# Patient Record
Sex: Female | Born: 1975
Health system: Southern US, Community
[De-identification: ages and names within clinical notes are randomized; demographics above are authoritative.]

## PROBLEM LIST (undated history)

## (undated) DIAGNOSIS — I10 Essential (primary) hypertension: Secondary | ICD-10-CM

## (undated) DIAGNOSIS — A549 Gonococcal infection, unspecified: Secondary | ICD-10-CM

## (undated) DIAGNOSIS — B9689 Other specified bacterial agents as the cause of diseases classified elsewhere: Secondary | ICD-10-CM

## (undated) DIAGNOSIS — N760 Acute vaginitis: Secondary | ICD-10-CM

## (undated) DIAGNOSIS — F419 Anxiety disorder, unspecified: Secondary | ICD-10-CM

## (undated) DIAGNOSIS — R519 Headache, unspecified: Secondary | ICD-10-CM

## (undated) DIAGNOSIS — O009 Unspecified ectopic pregnancy without intrauterine pregnancy: Secondary | ICD-10-CM

## (undated) DIAGNOSIS — A749 Chlamydial infection, unspecified: Secondary | ICD-10-CM

## (undated) DIAGNOSIS — M1990 Unspecified osteoarthritis, unspecified site: Secondary | ICD-10-CM

## (undated) HISTORY — PX: DILATION AND CURETTAGE OF UTERUS: SHX78

## (undated) HISTORY — PX: THERAPEUTIC ABORTION: SHX798

## (undated) HISTORY — PX: ECTOPIC PREGNANCY SURGERY: SHX613

---

## 2003-08-20 ENCOUNTER — Emergency Department (HOSPITAL_COMMUNITY): Admission: EM | Admit: 2003-08-20 | Discharge: 2003-08-20 | Payer: Self-pay | Admitting: Family Medicine

## 2003-10-05 ENCOUNTER — Emergency Department (HOSPITAL_COMMUNITY): Admission: EM | Admit: 2003-10-05 | Discharge: 2003-10-05 | Payer: Self-pay | Admitting: Emergency Medicine

## 2004-12-11 ENCOUNTER — Emergency Department (HOSPITAL_COMMUNITY): Admission: EM | Admit: 2004-12-11 | Discharge: 2004-12-11 | Payer: Self-pay | Admitting: Emergency Medicine

## 2005-04-16 ENCOUNTER — Emergency Department (HOSPITAL_COMMUNITY): Admission: EM | Admit: 2005-04-16 | Discharge: 2005-04-16 | Payer: Self-pay | Admitting: Emergency Medicine

## 2005-10-03 ENCOUNTER — Emergency Department (HOSPITAL_COMMUNITY): Admission: EM | Admit: 2005-10-03 | Discharge: 2005-10-04 | Payer: Self-pay | Admitting: Emergency Medicine

## 2006-03-05 ENCOUNTER — Emergency Department (HOSPITAL_COMMUNITY): Admission: EM | Admit: 2006-03-05 | Discharge: 2006-03-05 | Payer: Self-pay | Admitting: Emergency Medicine

## 2006-03-11 ENCOUNTER — Ambulatory Visit: Payer: Self-pay | Admitting: *Deleted

## 2007-02-26 ENCOUNTER — Emergency Department (HOSPITAL_COMMUNITY): Admission: EM | Admit: 2007-02-26 | Discharge: 2007-02-26 | Payer: Self-pay | Admitting: Family Medicine

## 2007-05-29 ENCOUNTER — Emergency Department (HOSPITAL_COMMUNITY): Admission: EM | Admit: 2007-05-29 | Discharge: 2007-05-29 | Payer: Self-pay | Admitting: Family Medicine

## 2007-06-22 ENCOUNTER — Emergency Department (HOSPITAL_COMMUNITY): Admission: EM | Admit: 2007-06-22 | Discharge: 2007-06-22 | Payer: Self-pay | Admitting: Family Medicine

## 2007-10-12 ENCOUNTER — Emergency Department (HOSPITAL_COMMUNITY): Admission: EM | Admit: 2007-10-12 | Discharge: 2007-10-12 | Payer: Self-pay | Admitting: Family Medicine

## 2008-08-28 ENCOUNTER — Emergency Department (HOSPITAL_COMMUNITY): Admission: EM | Admit: 2008-08-28 | Discharge: 2008-08-29 | Payer: Self-pay | Admitting: Emergency Medicine

## 2008-10-25 ENCOUNTER — Emergency Department (HOSPITAL_COMMUNITY): Admission: EM | Admit: 2008-10-25 | Discharge: 2008-10-25 | Payer: Self-pay | Admitting: Emergency Medicine

## 2008-11-27 ENCOUNTER — Emergency Department (HOSPITAL_COMMUNITY): Admission: EM | Admit: 2008-11-27 | Discharge: 2008-11-27 | Payer: Self-pay | Admitting: Emergency Medicine

## 2009-12-12 ENCOUNTER — Emergency Department (HOSPITAL_COMMUNITY): Admission: EM | Admit: 2009-12-12 | Discharge: 2009-12-12 | Payer: Self-pay | Admitting: Internal Medicine

## 2009-12-30 ENCOUNTER — Emergency Department (HOSPITAL_COMMUNITY): Admission: EM | Admit: 2009-12-30 | Discharge: 2009-12-31 | Payer: Self-pay | Admitting: Emergency Medicine

## 2010-01-01 ENCOUNTER — Inpatient Hospital Stay (HOSPITAL_COMMUNITY): Admission: AD | Admit: 2010-01-01 | Discharge: 2010-01-01 | Payer: Self-pay | Admitting: Family Medicine

## 2010-01-01 ENCOUNTER — Ambulatory Visit: Payer: Self-pay | Admitting: Family Medicine

## 2010-01-04 ENCOUNTER — Ambulatory Visit: Admission: RE | Admit: 2010-01-04 | Discharge: 2010-01-04 | Payer: Self-pay | Admitting: Obstetrics & Gynecology

## 2010-01-07 ENCOUNTER — Inpatient Hospital Stay (HOSPITAL_COMMUNITY): Admission: AD | Admit: 2010-01-07 | Discharge: 2010-01-07 | Payer: Self-pay | Admitting: Family Medicine

## 2010-01-07 ENCOUNTER — Ambulatory Visit: Payer: Self-pay | Admitting: Gynecology

## 2010-01-16 ENCOUNTER — Inpatient Hospital Stay (HOSPITAL_COMMUNITY): Admission: AD | Admit: 2010-01-16 | Discharge: 2010-01-16 | Payer: Self-pay | Admitting: Obstetrics and Gynecology

## 2010-01-23 ENCOUNTER — Inpatient Hospital Stay (HOSPITAL_COMMUNITY): Admission: AD | Admit: 2010-01-23 | Discharge: 2010-01-23 | Payer: Self-pay | Admitting: Obstetrics & Gynecology

## 2010-01-31 ENCOUNTER — Inpatient Hospital Stay (HOSPITAL_COMMUNITY): Admission: AD | Admit: 2010-01-31 | Discharge: 2010-01-31 | Payer: Self-pay | Admitting: Obstetrics & Gynecology

## 2010-02-01 ENCOUNTER — Inpatient Hospital Stay (HOSPITAL_COMMUNITY)
Admission: AD | Admit: 2010-02-01 | Discharge: 2010-02-01 | Payer: Self-pay | Source: Home / Self Care | Admitting: Obstetrics & Gynecology

## 2010-02-06 ENCOUNTER — Inpatient Hospital Stay (HOSPITAL_COMMUNITY)
Admission: AD | Admit: 2010-02-06 | Discharge: 2010-02-06 | Payer: Self-pay | Source: Home / Self Care | Admitting: Obstetrics & Gynecology

## 2010-02-06 DEATH — deceased

## 2010-02-13 ENCOUNTER — Inpatient Hospital Stay (HOSPITAL_COMMUNITY)
Admission: AD | Admit: 2010-02-13 | Discharge: 2010-02-13 | Payer: Self-pay | Source: Home / Self Care | Admitting: Obstetrics and Gynecology

## 2010-05-20 LAB — HCG, QUANTITATIVE, PREGNANCY
hCG, Beta Chain, Quant, S: <2 m[IU]/mL (ref <5)
hCG, Beta Chain, Quant, S: 1044 m[IU]/mL — ABNORMAL HIGH (ref <5)
hCG, Beta Chain, Quant, S: 58 m[IU]/mL — ABNORMAL HIGH (ref <5)
hCG, Beta Chain, Quant, S: 74 m[IU]/mL — ABNORMAL HIGH (ref <5)

## 2010-05-20 LAB — DIFFERENTIAL
Eosinophils Absolute: 0.2 10*3/uL (ref 0.0–0.7)
Eosinophils Relative: 4 % (ref 0–5)
Lymphs Abs: 1.7 10*3/uL (ref 0.7–4.0)
Monocytes Relative: 7 % (ref 3–12)

## 2010-05-20 LAB — CBC
MCH: 24.8 pg — ABNORMAL LOW (ref 26.0–34.0)
MCV: 77.1 fL — ABNORMAL LOW (ref 78.0–100.0)
Platelets: 323 10*3/uL (ref 150–400)
RDW: 15.2 % (ref 11.5–15.5)

## 2010-05-21 LAB — BASIC METABOLIC PANEL
CO2: 26 mEq/L (ref 19–32)
Calcium: 9.5 mg/dL (ref 8.4–10.5)
GFR calc Af Amer: >60 mL/min (ref >60)
Sodium: 137 mEq/L (ref 135–145)

## 2010-05-21 LAB — COMPREHENSIVE METABOLIC PANEL
Albumin: 3.6 g/dL (ref 3.5–5.2)
Alkaline Phosphatase: 45 U/L (ref 39–117)
BUN: 8 mg/dL (ref 6–23)
Chloride: 103 mEq/L (ref 96–112)
Creatinine, Ser: 0.79 mg/dL (ref 0.4–1.2)
GFR calc non Af Amer: >60 mL/min (ref >60)
Glucose, Bld: 94 mg/dL (ref 70–99)
Potassium: 3.8 mEq/L (ref 3.5–5.1)
Total Bilirubin: 0.5 mg/dL (ref 0.3–1.2)

## 2010-05-21 LAB — URINALYSIS, ROUTINE W REFLEX MICROSCOPIC
Glucose, UA: NEGATIVE mg/dL
Hgb urine dipstick: NEGATIVE
Ketones, ur: NEGATIVE mg/dL
Protein, ur: NEGATIVE mg/dL

## 2010-05-21 LAB — CBC
Hemoglobin: 11.5 g/dL — ABNORMAL LOW (ref 12.0–15.0)
Hemoglobin: 11.1 g/dL — ABNORMAL LOW (ref 12.0–15.0)
MCH: 24.7 pg — ABNORMAL LOW (ref 26.0–34.0)
MCH: 24.9 pg — ABNORMAL LOW (ref 26.0–34.0)
MCHC: 32.6 g/dL (ref 30.0–36.0)
MCV: 76.4 fL — ABNORMAL LOW (ref 78.0–100.0)
Platelets: 310 10*3/uL (ref 150–400)
RBC: 4.64 MIL/uL (ref 3.87–5.11)
RBC: 4.44 MIL/uL (ref 3.87–5.11)
WBC: 9.3 10*3/uL (ref 4.0–10.5)
WBC: 6.2 10*3/uL (ref 4.0–10.5)

## 2010-05-21 LAB — HCG, QUANTITATIVE, PREGNANCY
hCG, Beta Chain, Quant, S: 1927 m[IU]/mL — ABNORMAL HIGH (ref <5)
hCG, Beta Chain, Quant, S: 1657 m[IU]/mL — ABNORMAL HIGH (ref <5)
hCG, Beta Chain, Quant, S: 1819 m[IU]/mL — ABNORMAL HIGH (ref <5)

## 2010-05-21 LAB — WET PREP, GENITAL: Trich, Wet Prep: NONE SEEN

## 2010-05-21 LAB — URINE MICROSCOPIC-ADD ON

## 2010-05-21 LAB — ABO/RH: ABO/RH(D): A POS

## 2010-05-21 LAB — PREGNANCY, URINE: Preg Test, Ur: POSITIVE

## 2010-06-16 LAB — CBC
Hemoglobin: 11.1 g/dL — ABNORMAL LOW (ref 12.0–15.0)
MCHC: 33.2 g/dL (ref 30.0–36.0)
Platelets: 304 10*3/uL (ref 150–400)
RDW: 14.9 % (ref 11.5–15.5)

## 2010-06-16 LAB — COMPREHENSIVE METABOLIC PANEL
ALT: 20 U/L (ref 0–35)
Albumin: 3.5 g/dL (ref 3.5–5.2)
Alkaline Phosphatase: 36 U/L — ABNORMAL LOW (ref 39–117)
Calcium: 9.1 mg/dL (ref 8.4–10.5)
GFR calc Af Amer: >60 mL/min (ref >60)
Glucose, Bld: 92 mg/dL (ref 70–99)
Potassium: 3.7 mEq/L (ref 3.5–5.1)
Sodium: 135 mEq/L (ref 135–145)
Total Protein: 7 g/dL (ref 6.0–8.3)

## 2010-06-16 LAB — GC/CHLAMYDIA PROBE AMP, GENITAL
Chlamydia, DNA Probe: NEGATIVE
GC Probe Amp, Genital: NEGATIVE

## 2010-06-16 LAB — WET PREP, GENITAL
Trich, Wet Prep: NONE SEEN
WBC, Wet Prep HPF POC: NONE SEEN
Yeast Wet Prep HPF POC: NONE SEEN

## 2010-06-16 LAB — URINALYSIS, ROUTINE W REFLEX MICROSCOPIC
Ketones, ur: NEGATIVE mg/dL
Nitrite: NEGATIVE
Protein, ur: NEGATIVE mg/dL
Urobilinogen, UA: 1 mg/dL (ref 0.0–1.0)

## 2010-06-16 LAB — PREGNANCY, URINE: Preg Test, Ur: POSITIVE

## 2010-06-16 LAB — DIFFERENTIAL
Eosinophils Absolute: 0.3 10*3/uL (ref 0.0–0.7)
Lymphs Abs: 2.1 10*3/uL (ref 0.7–4.0)
Monocytes Absolute: 0.4 10*3/uL (ref 0.1–1.0)
Monocytes Relative: 5 % (ref 3–12)
Neutrophils Relative %: 65 % (ref 43–77)

## 2010-07-12 ENCOUNTER — Emergency Department (HOSPITAL_COMMUNITY)
Admission: EM | Admit: 2010-07-12 | Discharge: 2010-07-12 | Disposition: A | Discharge status: Home / Self Care | Payer: No Typology Code available for payment source | Attending: Emergency Medicine | Admitting: Emergency Medicine

## 2010-07-12 DIAGNOSIS — E669 Obesity, unspecified: Secondary | ICD-10-CM | POA: Insufficient documentation

## 2010-07-12 DIAGNOSIS — M25569 Pain in unspecified knee: Secondary | ICD-10-CM | POA: Insufficient documentation

## 2010-07-12 DIAGNOSIS — M62838 Other muscle spasm: Secondary | ICD-10-CM | POA: Insufficient documentation

## 2010-07-12 DIAGNOSIS — M79609 Pain in unspecified limb: Secondary | ICD-10-CM | POA: Insufficient documentation

## 2010-07-12 LAB — POCT I-STAT, CHEM 8
Creatinine, Ser: 1 mg/dL (ref 0.4–1.2)
Glucose, Bld: 92 mg/dL (ref 70–99)
HCT: 36 % (ref 36.0–46.0)
Hemoglobin: 12.2 g/dL (ref 12.0–15.0)
Sodium: 141 mEq/L (ref 135–145)
TCO2: 27 mmol/L (ref 0–100)

## 2010-12-29 ENCOUNTER — Encounter (HOSPITAL_COMMUNITY): Payer: Self-pay | Admitting: *Deleted

## 2010-12-29 ENCOUNTER — Inpatient Hospital Stay (HOSPITAL_COMMUNITY)
Admission: AD | Admit: 2010-12-29 | Discharge: 2010-12-29 | Disposition: A | Discharge status: Home / Self Care | Payer: No Typology Code available for payment source | Source: Ambulatory Visit | Attending: Obstetrics & Gynecology | Admitting: Obstetrics & Gynecology

## 2010-12-29 DIAGNOSIS — B9689 Other specified bacterial agents as the cause of diseases classified elsewhere: Secondary | ICD-10-CM | POA: Insufficient documentation

## 2010-12-29 DIAGNOSIS — N760 Acute vaginitis: Secondary | ICD-10-CM | POA: Insufficient documentation

## 2010-12-29 DIAGNOSIS — A499 Bacterial infection, unspecified: Secondary | ICD-10-CM

## 2010-12-29 DIAGNOSIS — M545 Low back pain, unspecified: Secondary | ICD-10-CM | POA: Insufficient documentation

## 2010-12-29 HISTORY — DX: Unspecified ectopic pregnancy without intrauterine pregnancy: O00.90

## 2010-12-29 LAB — URINALYSIS, ROUTINE W REFLEX MICROSCOPIC
Bilirubin Urine: NEGATIVE
Glucose, UA: NEGATIVE mg/dL
Hgb urine dipstick: NEGATIVE
Ketones, ur: NEGATIVE mg/dL
pH: 6 (ref 5.0–8.0)

## 2010-12-29 LAB — WET PREP, GENITAL
Trich, Wet Prep: NONE SEEN
Yeast Wet Prep HPF POC: NONE SEEN

## 2010-12-29 MED ORDER — METRONIDAZOLE 500 MG PO TABS: 500.0000 mg | ORAL_TABLET | Freq: Two times a day (BID) | ORAL | Status: AC

## 2010-12-29 MED ORDER — IBUPROFEN 600 MG PO TABS: 600.0000 mg | ORAL_TABLET | Freq: Four times a day (QID) | ORAL | Status: AC | PRN

## 2010-12-29 MED ORDER — CYCLOBENZAPRINE HCL 10 MG PO TABS
10.0000 mg | ORAL_TABLET | Freq: Two times a day (BID) | ORAL | Status: AC | PRN
Start: 2010-12-29 — End: 2011-01-08

## 2010-12-29 NOTE — ED Provider Notes (Signed)
History     CSN: [id number] Arrival date & time: 12/29/2010 10:35 AM   None     Chief Complaint  Patient presents with  . Back Pain   HPI Victoria ZAUGG is a 35 y.o. female who presents to MAU for low back pain that started 12/08/10 but usually has same pain before period starts. After period ended continues to have the same pain. LMP 12/18/10. Pain radiates from lower back around to lower abdomen. Pain increases with walking, movement, staying in same position for a long time and lifting. Current sex partner x 4 years. History of GC and Chlamydia "years ago". The history was provided by the patient.  Past Medical History  Diagnosis Date  . Pregnancy, ectopic     Past Surgical History  Procedure Date  . No past surgeries     No family history on file.  History  Substance Use Topics  . Smoking status: Current Some Day Smoker  . Smokeless tobacco: Not on file  . Alcohol Use: Yes    OB History    Grav Para Term Preterm Abortions TAB SAB Ect Mult Living   7    3 2  1  4       Review of Systems  Constitutional: Negative for fever, chills, diaphoresis, fatigue and unexpected weight change.  HENT: Negative for ear pain, congestion, sore throat, facial swelling, neck pain, neck stiffness, dental problem and sinus pressure.   Eyes: Negative for photophobia, pain and discharge.  Respiratory: Negative for cough, chest tightness and wheezing.   Cardiovascular: Negative.   Gastrointestinal: Positive for nausea, abdominal pain and constipation. Negative for vomiting and diarrhea.  Genitourinary: Negative for dysuria, urgency, frequency, flank pain, vaginal bleeding, vaginal discharge and difficulty urinating.  Musculoskeletal: Positive for myalgias and back pain. Negative for gait problem.  Skin: Positive for rash. Negative for color change.  Neurological: Positive for headaches. Negative for dizziness, speech difficulty, weakness, light-headedness and numbness.    Psychiatric/Behavioral: Negative for confusion and agitation.    Allergies  Review of patient's allergies indicates no known allergies.  Home Medications  No current outpatient prescriptions on file.  BP 117/70  Pulse 84  Temp(Src) 98.6 F (37 C) (Oral)  Resp 20  Ht 5\' 6"  (1.676 m)  Wt 240 lb (108.863 kg)  BMI 38.74 kg/m2  LMP 12/18/2010  Physical Exam  Nursing note and vitals reviewed. Constitutional: She is oriented to person, place, and time. She appears well-developed and well-nourished.  HENT:  Head: Normocephalic and atraumatic.  Eyes: EOM are normal.  Neck: Neck supple.  Cardiovascular: Normal rate.   Pulmonary/Chest: Effort normal.  Abdominal: Soft. There is no tenderness.  Genitourinary:       External genitalia without lesions. White discharge vaginal vault. No CMT, no adnexal tenderness. Uterus without palpable enlargement.  Musculoskeletal: Normal range of motion.  Neurological: She is alert and oriented to person, place, and time. She has normal strength and normal reflexes. No cranial nerve deficit. She displays a negative Romberg sign.  Skin: Skin is warm and dry.  Psychiatric: She has a normal mood and affect. Her behavior is normal. Judgment and thought content normal.   Assessment: Low back strain   Bacterial vaginosis  Plan:  Flexeril 10 mg. Po bid   Ibuprofen 600 mg. Po q 6 hours   Flagyl 500 mg. Po bid x 7 days  ED Course  Procedures MDM          Kerrie Buffalo, NP 12/29/10 1232

## 2010-12-29 NOTE — Progress Notes (Signed)
Pt states she is having lower abdominal and back pain for a couple of days, feels constipated. Pt had BM today but was small and really hard.Pt has taken nothing to help with constipation. No vaginal bleeding or discharge.  Pt has a hx of an ectopic and pt states it feels like it did then. Uses condoms for [facility] occasionally.

## 2010-12-29 NOTE — Progress Notes (Signed)
Pressure and pain in low back and abd.  Constipated, has tried increasing bran.  When does goes, is a small hard stool.

## 2010-12-30 LAB — GC/CHLAMYDIA PROBE AMP, GENITAL: Chlamydia, DNA Probe: NEGATIVE

## 2011-02-26 ENCOUNTER — Inpatient Hospital Stay (HOSPITAL_COMMUNITY)
Admission: AD | Admit: 2011-02-26 | Discharge: 2011-02-26 | Disposition: A | Discharge status: Home / Self Care | Payer: No Typology Code available for payment source | Source: Ambulatory Visit | Attending: Obstetrics & Gynecology | Admitting: Obstetrics & Gynecology

## 2011-02-26 ENCOUNTER — Encounter (HOSPITAL_COMMUNITY): Payer: Self-pay | Admitting: *Deleted

## 2011-02-26 ENCOUNTER — Inpatient Hospital Stay (HOSPITAL_COMMUNITY): Payer: No Typology Code available for payment source

## 2011-02-26 DIAGNOSIS — O26899 Other specified pregnancy related conditions, unspecified trimester: Secondary | ICD-10-CM

## 2011-02-26 DIAGNOSIS — B3731 Acute candidiasis of vulva and vagina: Secondary | ICD-10-CM | POA: Insufficient documentation

## 2011-02-26 DIAGNOSIS — B9689 Other specified bacterial agents as the cause of diseases classified elsewhere: Secondary | ICD-10-CM | POA: Insufficient documentation

## 2011-02-26 DIAGNOSIS — O2390 Unspecified genitourinary tract infection in pregnancy, unspecified trimester: Secondary | ICD-10-CM | POA: Insufficient documentation

## 2011-02-26 DIAGNOSIS — O219 Vomiting of pregnancy, unspecified: Secondary | ICD-10-CM

## 2011-02-26 DIAGNOSIS — N760 Acute vaginitis: Secondary | ICD-10-CM | POA: Insufficient documentation

## 2011-02-26 DIAGNOSIS — B373 Candidiasis of vulva and vagina: Secondary | ICD-10-CM | POA: Insufficient documentation

## 2011-02-26 DIAGNOSIS — A499 Bacterial infection, unspecified: Secondary | ICD-10-CM | POA: Insufficient documentation

## 2011-02-26 DIAGNOSIS — R109 Unspecified abdominal pain: Secondary | ICD-10-CM | POA: Insufficient documentation

## 2011-02-26 LAB — CBC
HCT: 35.5 % — ABNORMAL LOW (ref 36.0–46.0)
Hemoglobin: 11.3 g/dL — ABNORMAL LOW (ref 12.0–15.0)
MCH: 24.3 pg — ABNORMAL LOW (ref 26.0–34.0)
MCHC: 31.8 g/dL (ref 30.0–36.0)
RDW: 15 % (ref 11.5–15.5)

## 2011-02-26 LAB — POCT PREGNANCY, URINE: Preg Test, Ur: POSITIVE

## 2011-02-26 LAB — HCG, QUANTITATIVE, PREGNANCY: hCG, Beta Chain, Quant, S: 33383 m[IU]/mL — ABNORMAL HIGH (ref <5)

## 2011-02-26 LAB — URINALYSIS, ROUTINE W REFLEX MICROSCOPIC
Glucose, UA: NEGATIVE mg/dL
Ketones, ur: NEGATIVE mg/dL
Leukocytes, UA: NEGATIVE
Protein, ur: NEGATIVE mg/dL
Urobilinogen, UA: 1 mg/dL (ref 0.0–1.0)

## 2011-02-26 LAB — WET PREP, GENITAL

## 2011-02-26 MED ORDER — PROMETHAZINE HCL 25 MG PO TABS: 25.0000 mg | ORAL_TABLET | Freq: Four times a day (QID) | ORAL | Status: AC | PRN

## 2011-02-26 MED ORDER — FLUCONAZOLE 150 MG PO TABS: 150.0000 mg | ORAL_TABLET | Freq: Once | ORAL | Status: AC

## 2011-02-26 MED ORDER — ONDANSETRON 8 MG PO TBDP
8.0000 mg | ORAL_TABLET | Freq: Once | ORAL | Status: AC
  Administered 2011-02-26: 8 mg via ORAL
  Filled 2011-02-26: qty 1

## 2011-02-26 MED ORDER — ONDANSETRON 8 MG PO TBDP: 8.0000 mg | ORAL_TABLET | Freq: Three times a day (TID) | ORAL | Status: AC | PRN

## 2011-02-26 MED ORDER — METRONIDAZOLE 500 MG PO TABS: 500.0000 mg | ORAL_TABLET | Freq: Two times a day (BID) | ORAL | Status: AC

## 2011-02-26 NOTE — ED Provider Notes (Signed)
History     Chief Complaint  Patient presents with  . Abdominal Pain   HPI Pt is [redacted] weeks pregnant by LMP 01/15/2011 with c/o of abdominal cramping and vaginal pain.  Pt has hx of ectopic pregnancy.  Pt is not in long term relationship.   Past Medical History  Diagnosis Date  . Pregnancy, ectopic     Past Surgical History  Procedure Date  . Ectopic pregnancy surgery   . Dilation and curettage of uterus     Family History  Problem Relation Age of Onset  . Diabetes Maternal Grandmother     History  Substance Use Topics  . Smoking status: Former Games developer  . Smokeless tobacco: Not on file  . Alcohol Use: Yes     occasionally    Allergies: No Known Allergies  Prescriptions prior to admission  Medication Sig Dispense Refill  . acetaminophen (TYLENOL) 500 MG tablet Take 1,000 mg by mouth daily as needed. For pain       . diphenhydrAMINE (BENADRYL) 25 mg capsule Take 50 mg by mouth 2 (two) times daily as needed. For itching         ROS Physical Exam   Blood pressure 123/67, pulse 88, temperature 99.1 F (37.3 C), temperature source Oral, resp. rate 20, height 5\' 5"  (1.651 m), weight 244 lb (110.678 kg), last menstrual period 01/15/2011, SpO2 99.00%.  Physical Exam  Vitals reviewed. Constitutional: She appears well-developed and well-nourished.  HENT:  Head: Normocephalic.  Eyes: Pupils are equal, round, and reactive to light.  Neck: Normal range of motion.  Cardiovascular: Normal rate.   Respiratory: Effort normal.  GI: Soft. She exhibits no distension. There is no tenderness.  Genitourinary: Vagina normal and uterus normal.       Slightly reddened vaginal mucosa; small amount of frothy white discharge in vault; cervix clean, NT; right adnexa slightly tender no rebound or guarding; exam complicated by habitus  Neurological: She is alert.  Skin: Skin is warm and dry.  Psychiatric: She has a normal mood and affect.    MAU Course  Procedures Wet  prep GC/chlamydia Ultrasound worksheet showed IUP [redacted]w[redacted]d with EDC 10/13/2011 ; YS, embryo with cardiac activity 139bpm Right CLC noted Results for orders placed during the hospital encounter of 02/26/11 (from the past 24 hour(s))  URINALYSIS, ROUTINE W REFLEX MICROSCOPIC     Status: Normal   Collection Time   02/26/11  6:25 PM      Component Value Range   Color, Urine YELLOW  YELLOW    APPearance CLEAR  CLEAR    Specific Gravity, Urine 1.020  1.005 - 1.030    pH 7.0  5.0 - 8.0    Glucose, UA NEGATIVE  NEGATIVE (mg/dL)   Hgb urine dipstick NEGATIVE  NEGATIVE    Bilirubin Urine NEGATIVE  NEGATIVE    Ketones, ur NEGATIVE  NEGATIVE (mg/dL)   Protein, ur NEGATIVE  NEGATIVE (mg/dL)   Urobilinogen, UA 1.0  0.0 - 1.0 (mg/dL)   Nitrite NEGATIVE  NEGATIVE    Leukocytes, UA NEGATIVE  NEGATIVE   POCT PREGNANCY, URINE     Status: Normal   Collection Time   02/26/11  6:29 PM      Component Value Range   Preg Test, Ur POSITIVE    CBC     Status: Abnormal   Collection Time   02/26/11  6:57 PM      Component Value Range   WBC 10.0  4.0 - 10.5 (K/uL)   RBC  4.65  3.87 - 5.11 (MIL/uL)   Hemoglobin 11.3 (*) 12.0 - 15.0 (g/dL)   HCT 16.1 (*) 09.6 - 46.0 (%)   MCV 76.3 (*) 78.0 - 100.0 (fL)   MCH 24.3 (*) 26.0 - 34.0 (pg)   MCHC 31.8  30.0 - 36.0 (g/dL)   RDW 04.5  40.9 - 81.1 (%)   Platelets 336  150 - 400 (K/uL)  HCG, QUANTITATIVE, PREGNANCY     Status: Abnormal   Collection Time   02/26/11  6:57 PM      Component Value Range   hCG, Beta Chain, Quant, [place] [address] (*) <5 (mIU/mL)  WET PREP, GENITAL     Status: Abnormal   Collection Time   02/26/11  7:10 PM      Component Value Range   Yeast, Wet Prep NONE SEEN  NONE SEEN    Trich, Wet Prep NONE SEEN  NONE SEEN    Clue Cells, Wet Prep FEW (*) NONE SEEN    WBC, Wet Prep HPF POC TOO NUMEROUS TO COUNT (*) NONE SEEN    Assessment and Plan  IUP BV Yeast clinically Morning sickness  LINEBERRY,SUSAN 02/26/2011, 8:48 PM

## 2011-02-26 NOTE — Progress Notes (Addendum)
Wk ago- loss of appetite. Feels weak, lower abd pain, left upper quad pain , pain into back.,( past couple weeks) Aching in chest, years- was evaluated "gas".  Tingling/irritation in vaginal area, for past 1-2wks.  Period early Nov, usually comes end of the month.  One poitive, one negative home tests.

## 2011-02-26 NOTE — Progress Notes (Signed)
Pt in c/o lower abdominal pain and lower back pain x2 weeks.  Reports sob x2 weeks.  Vaginal irritation with clear discharge.  Reports spotting after washing with washcloth.  LMP 01/15/11.  + UPT and - UPT at home.

## 2011-02-27 LAB — GC/CHLAMYDIA PROBE AMP, GENITAL: GC Probe Amp, Genital: NEGATIVE

## 2011-03-10 NOTE — ED Provider Notes (Signed)
Agree with above note.  Victoria Dougal H. 03/10/2011 9:22 PM

## 2011-06-16 ENCOUNTER — Emergency Department (INDEPENDENT_AMBULATORY_CARE_PROVIDER_SITE_OTHER)
Admission: EM | Admit: 2011-06-16 | Discharge: 2011-06-16 | Disposition: A | Discharge status: Home / Self Care | Payer: No Typology Code available for payment source | Source: Home / Self Care | Attending: Emergency Medicine | Admitting: Emergency Medicine

## 2011-06-16 ENCOUNTER — Encounter (HOSPITAL_COMMUNITY): Payer: Self-pay | Admitting: Cardiology

## 2011-06-16 DIAGNOSIS — K5289 Other specified noninfective gastroenteritis and colitis: Secondary | ICD-10-CM

## 2011-06-16 DIAGNOSIS — K529 Noninfective gastroenteritis and colitis, unspecified: Secondary | ICD-10-CM

## 2011-06-16 HISTORY — DX: Chlamydial infection, unspecified: A74.9

## 2011-06-16 HISTORY — DX: Other specified bacterial agents as the cause of diseases classified elsewhere: B96.89

## 2011-06-16 HISTORY — DX: Acute vaginitis: N76.0

## 2011-06-16 HISTORY — DX: Gonococcal infection, unspecified: A54.9

## 2011-06-16 LAB — POCT PREGNANCY, URINE: Preg Test, Ur: NEGATIVE

## 2011-06-16 LAB — POCT URINALYSIS DIP (DEVICE)
Bilirubin Urine: NEGATIVE
Glucose, UA: NEGATIVE mg/dL
Ketones, ur: NEGATIVE mg/dL
Leukocytes, UA: NEGATIVE
Nitrite: NEGATIVE

## 2011-06-16 MED ORDER — ACIDOPHILUS PROBIOTIC BLEND PO CAPS: 2.0000 | ORAL_CAPSULE | Freq: Two times a day (BID) | ORAL | Status: DC

## 2011-06-16 MED ORDER — ONDANSETRON 8 MG PO TBDP
ORAL_TABLET | ORAL | Status: AC
Start: 2011-06-16 — End: 2011-06-23

## 2011-06-16 NOTE — ED Notes (Signed)
Pt reports abdominal pain in the upper quadrants that started this past Sunday. Decrease in food intake and fluids. Pt has had coffee today. Pt has 1 time episode of vomiting "spit" today. She is weak and dizzy today. 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 Pt has had 2 loose stools today in small amt.

## 2011-06-16 NOTE — ED Provider Notes (Signed)
History     CSN: [id number]  Arrival date & time 06/16/11  1704   First MD Initiated Contact with Patient 06/16/11 1827      Chief Complaint  Patient presents with  . Abdominal Pain  . Dizziness  . Nausea  . Diarrhea    (Consider location/radiation/quality/duration/timing/severity/associated sxs/prior treatment) HPI Comments: Patient with nausea, loose, nonbloody stools starting 2 days ago. Some nausea, no vomiting. No fevers. Slightly decreased appetite, but no anorexia. No abdominal distention, urinary complaints. No URI like symptoms. No vaginal complaints. Patient's daughter recently recovered from similar symptoms. No recent travel, recent and biotics, raw/undercooked foods, questionable leftovers. No aggravating, alleviating factors. Patient has not tried anything for her symptoms  ROS as noted in HPI. All other ROS negative.   Patient is a 36 y.o. female presenting with diarrhea. The history is provided by the patient. No language interpreter was used.  Diarrhea The primary symptoms include diarrhea. The illness began 2 days ago.  The illness does not include anorexia.    Past Medical History  Diagnosis Date  . Pregnancy, ectopic   . BV (bacterial vaginosis)   . Gonorrhea   . Chlamydia     Past Surgical History  Procedure Date  . Ectopic pregnancy surgery   . Dilation and curettage of uterus     Family History  Problem Relation Age of Onset  . Diabetes Maternal Grandmother   . Heart disease Maternal Grandmother     History  Substance Use Topics  . Smoking status: Current Some Day Smoker    Types: Cigarettes  . Smokeless tobacco: Not on file  . Alcohol Use: Yes     occasionally    OB History    Grav Para Term Preterm Abortions TAB SAB Ect Mult Living   8    3 2  1  4       Review of Systems  Gastrointestinal: Positive for diarrhea. Negative for anorexia.    Allergies  Review of patient's allergies indicates no known allergies.  Home  Medications   Current Outpatient Rx  Name Route Sig Dispense Refill  . ONDANSETRON 8 MG PO TBDP  1/2- 1 tablet q 8 hr prn nausea, vomiting 20 tablet 0  . ACIDOPHILUS PROBIOTIC BLEND PO CAPS Oral Take 2 capsules by mouth 2 (two) times daily. 60 capsule 0    BP 122/80  Pulse 96  Temp(Src) 98.3 F (36.8 C) (Oral)  Resp 20  SpO2 100%  LMP 06/08/2011  Breastfeeding? No  Filed Vitals:   06/16/11 1855 06/16/11 1951 06/16/11 1953 06/16/11 1954  BP: 115/80 113/82 115/78 122/80  Pulse: 89 80 84 96  Temp: 98.3 F (36.8 C)     TempSrc: Oral     Resp: 20     SpO2: 100%        Physical Exam  Nursing note and vitals reviewed. Constitutional: She is oriented to person, place, and time. She appears well-developed and well-nourished. No distress.  HENT:  Head: Normocephalic and atraumatic.  Eyes: Conjunctivae and EOM are normal.  Neck: Normal range of motion.  Cardiovascular: Normal rate.   Pulmonary/Chest: Effort normal and breath sounds normal.  Abdominal: Soft. Bowel sounds are normal. She exhibits no distension. There is no tenderness. There is no rebound, no guarding and no CVA tenderness.  Musculoskeletal: Normal range of motion.  Neurological: She is alert and oriented to person, place, and time.  Skin: Skin is warm and dry.  Psychiatric: She has a normal mood and affect.  Her behavior is normal. Judgment and thought content normal.    ED Course  Procedures (including critical care time)   Labs Reviewed  POCT URINALYSIS DIP (DEVICE)  POCT PREGNANCY, URINE  LAB REPORT - SCANNED   No results found.   1. Gastroenteritis      MDM  Previous records reviewed. Additional medical history obtained. Abdomen benign, patient nontoxic. Nausea since the morning of predominant component of her symptoms, rather than diarrhea. This is an infectious colitis. Will send her home with her probiotics & Zofran. Patient agrees with plan  Luiz Blare, MD 06/18/11 [phone]

## 2011-06-16 NOTE — Discharge Instructions (Signed)
Colitis Colitis is inflammation of the colon. Colitis can be a short-term or long-standing (chronic) illness. Crohn's disease and ulcerative colitis are 2 types of colitis which are chronic. They usually require lifelong treatment. CAUSES  There are many different causes of colitis, including:  Viruses.   Germs (bacteria).   Medicine reactions.  SYMPTOMS   Diarrhea.   Intestinal bleeding.   Pain.   Fever.   Throwing up (vomiting).   Tiredness (fatigue).   Weight loss.   Bowel blockage.  DIAGNOSIS  The diagnosis of colitis is based on examination and stool or blood tests. X-rays, CT scan, and colonoscopy may also be needed. TREATMENT  Treatment may include:  Fluids given through the vein (intravenously).   Bowel rest (nothing to eat or drink for a period of time).   Medicine for pain and diarrhea.   Medicines (antibiotics) that kill germs.   Cortisone medicines.   Surgery.  HOME CARE INSTRUCTIONS   Get plenty of rest.   Drink enough water and fluids to keep your urine clear or pale yellow.   Eat a well-balanced diet.   Call your caregiver for follow-up as recommended.  SEEK IMMEDIATE MEDICAL CARE IF:   You develop chills.   You have an oral temperature above 102 F (38.9 C), not controlled by medicine.   You have extreme weakness, fainting, or dehydration.   You have repeated vomiting.   You develop severe belly (abdominal) pain or are passing bloody or tarry stools.  MAKE SURE YOU:   Understand these instructions.   Will watch your condition.   Will get help right away if you are not doing well or get worse.  Document Released: 04/02/2004 Document Revised: 02/12/2011 Document Reviewed: 06/28/2009 Exit[employer] Patient Information 2012 [employer]. 

## 2012-06-09 ENCOUNTER — Encounter (HOSPITAL_COMMUNITY): Payer: Self-pay | Admitting: *Deleted

## 2012-06-09 ENCOUNTER — Inpatient Hospital Stay (HOSPITAL_COMMUNITY)
Admission: AD | Admit: 2012-06-09 | Discharge: 2012-06-10 | Disposition: A | Discharge status: Home / Self Care | Payer: Self-pay | Source: Ambulatory Visit | Attending: Obstetrics & Gynecology | Admitting: Obstetrics & Gynecology

## 2012-06-09 ENCOUNTER — Inpatient Hospital Stay (HOSPITAL_COMMUNITY): Payer: Self-pay

## 2012-06-09 DIAGNOSIS — N912 Amenorrhea, unspecified: Secondary | ICD-10-CM | POA: Insufficient documentation

## 2012-06-09 DIAGNOSIS — M549 Dorsalgia, unspecified: Secondary | ICD-10-CM | POA: Insufficient documentation

## 2012-06-09 DIAGNOSIS — N949 Unspecified condition associated with female genital organs and menstrual cycle: Secondary | ICD-10-CM | POA: Insufficient documentation

## 2012-06-09 DIAGNOSIS — R109 Unspecified abdominal pain: Secondary | ICD-10-CM | POA: Insufficient documentation

## 2012-06-09 DIAGNOSIS — R102 Pelvic and perineal pain: Secondary | ICD-10-CM

## 2012-06-09 LAB — WET PREP, GENITAL
Trich, Wet Prep: NONE SEEN
Yeast Wet Prep HPF POC: NONE SEEN

## 2012-06-09 LAB — URINE MICROSCOPIC-ADD ON

## 2012-06-09 LAB — URINALYSIS, ROUTINE W REFLEX MICROSCOPIC
Nitrite: NEGATIVE
Protein, ur: NEGATIVE mg/dL
Specific Gravity, Urine: 1.01 (ref 1.005–1.030)
Urobilinogen, UA: 1 mg/dL (ref 0.0–1.0)

## 2012-06-09 LAB — CBC: Platelets: 388 10*3/uL (ref 150–400)

## 2012-06-09 LAB — HCG, QUANTITATIVE, PREGNANCY: hCG, Beta Chain, Quant, S: <1 m[IU]/mL (ref <5)

## 2012-06-09 MED ORDER — CYCLOBENZAPRINE HCL 10 MG PO TABS: 10.0000 mg | ORAL_TABLET | Freq: Once | ORAL | Status: DC

## 2012-06-09 MED ORDER — KETOROLAC TROMETHAMINE 60 MG/2ML IM SOLN
60.0000 mg | Freq: Once | INTRAMUSCULAR | Status: AC
  Administered 2012-06-09: 60 mg via INTRAMUSCULAR
  Filled 2012-06-09: qty 2

## 2012-06-09 NOTE — MAU Provider Note (Signed)
History     CSN: [id number]  Arrival date and time: 06/09/12 2118   First Provider Initiated Contact with Patient 06/09/12 2210      Chief Complaint  Patient presents with  . Abdominal Pain  . Back Pain   HPI  Victoria Rivers is a 37 y.o. [id number] who presents today with 8/10 back pain and lower abdominal pain X 1 month. She has tried tylenol and good power without relief. She states her LMP was 04/22/12. She has a history of ectopic pregnancy.   Past Medical History  Diagnosis Date  . Pregnancy, ectopic   . BV (bacterial vaginosis)   . Gonorrhea   . Chlamydia     Past Surgical History  Procedure Laterality Date  . Ectopic pregnancy surgery    . Dilation and curettage of uterus      Family History  Problem Relation Age of Onset  . Diabetes Maternal Grandmother   . Heart disease Maternal Grandmother     History  Substance Use Topics  . Smoking status: Current Some Day Smoker    Types: Cigarettes  . Smokeless tobacco: Not on file  . Alcohol Use: Yes     Comment: occasionally    Allergies: No Known Allergies  Prescriptions prior to admission  Medication Sig Dispense Refill  . acetaminophen (TYLENOL) 325 MG tablet Take 650 mg by mouth daily as needed for pain.      . Aspirin-Salicylamide-Caffeine (BC HEADACHE POWDER PO) Take 1 Package by mouth daily as needed (for pain).        Review of Systems  Constitutional: Negative for fever and chills.  Eyes: Negative for blurred vision.  Gastrointestinal: Positive for abdominal pain. Negative for nausea, vomiting, diarrhea and constipation.  Genitourinary: Negative for dysuria, urgency and frequency.  Musculoskeletal: Negative for myalgias.  Neurological: Negative for dizziness and headaches.   Physical Exam   Blood pressure 129/73, temperature 98.3 F (36.8 C), temperature source Oral, resp. rate 18, height 5\' 7"  (1.702 m), weight 115.667 kg (255 lb), last menstrual period 04/22/2012, SpO2 100.00%.  Physical Exam   Nursing note and vitals reviewed. Constitutional: She is oriented to person, place, and time. She appears well-developed and well-nourished. No distress.  Cardiovascular: Normal rate.   Respiratory: Effort normal.  GI: Soft.  Genitourinary:   External: no lesion Vagina: small amount of white discharge Cervix: friable, CMT present Uterus: AV, NSSC Adnexa: unable to palpate  Neurological: She is alert and oriented to person, place, and time.  Skin: Skin is warm and dry.  Psychiatric: She has a normal mood and affect.    MAU Course  Procedures  Results for orders placed during the hospital encounter of 06/09/12 (from the past 24 hour(s))  URINALYSIS, ROUTINE W REFLEX MICROSCOPIC     Status: Abnormal   Collection Time    06/09/12  9:40 PM      Result Value Range   Color, Urine YELLOW  YELLOW   APPearance CLEAR  CLEAR   Specific Gravity, Urine 1.010  1.005 - 1.030   pH 6.0  5.0 - 8.0   Glucose, UA NEGATIVE  NEGATIVE mg/dL   Hgb urine dipstick NEGATIVE  NEGATIVE   Bilirubin Urine NEGATIVE  NEGATIVE   Ketones, ur NEGATIVE  NEGATIVE mg/dL   Protein, ur NEGATIVE  NEGATIVE mg/dL   Urobilinogen, UA 1.0  0.0 - 1.0 mg/dL   Nitrite NEGATIVE  NEGATIVE   Leukocytes, UA TRACE (*) NEGATIVE  URINE MICROSCOPIC-ADD ON     Status:  Abnormal   Collection Time    06/09/12  9:40 PM      Result Value Range   Squamous Epithelial / LPF FEW (*) RARE   WBC, UA 0-2  <3 WBC/hpf   Bacteria, UA RARE  RARE   Urine-Other MUCOUS PRESENT    WET PREP, GENITAL     Status: Abnormal   Collection Time    06/09/12 10:13 PM      Result Value Range   Yeast Wet Prep HPF POC NONE SEEN  NONE SEEN   Trich, Wet Prep NONE SEEN  NONE SEEN   Clue Cells Wet Prep HPF POC FEW (*) NONE SEEN   WBC, Wet Prep HPF POC FEW (*) NONE SEEN  HCG, QUANTITATIVE, PREGNANCY     Status: None   Collection Time    06/09/12 10:20 PM      Result Value Range   hCG, Beta Chain, Quant, S <1  <5 mIU/mL  CBC     Status: Abnormal    Collection Time    06/09/12 10:20 PM      Result Value Range   WBC 8.8  4.0 - 10.5 K/uL   RBC 4.72  3.87 - 5.11 MIL/uL   Hemoglobin 10.7 (*) 12.0 - 15.0 g/dL   HCT 40.9 (*) 81.1 - 91.4 %   MCV 74.4 (*) 78.0 - 100.0 fL   MCH 22.7 (*) 26.0 - 34.0 pg   MCHC 30.5  30.0 - 36.0 g/dL   RDW 78.2  95.6 - 21.3 %   Platelets 388  150 - 400 K/uL    *RADIOLOGY REPORT*  Clinical Data: Pelvic pain  TRANSABDOMINAL AND TRANSVAGINAL ULTRASOUND OF PELVIS  Technique: Both transabdominal and transvaginal ultrasound  examinations of the pelvis were performed. Transabdominal technique  was performed for global imaging of the pelvis including uterus,  ovaries, adnexal regions, and pelvic cul-de-sac.  It was necessary to proceed with endovaginal exam following the  transabdominal exam to visualize the adnexa.  Comparison: None  Findings:  Uterus: Normal in size and appearance  Endometrium: Normal in thickness and appearance  Right ovary: Normal appearance/no adnexal mass  Left ovary: Normal appearance/no adnexal mass  Other findings: No free fluid  IMPRESSION:  No acute intrapelvic pathology.  Original Report Authenticated By: Jolaine Click, M.D.  Assessment and Plan   1. Amenorrhea   2. Pelvic pain    FU with PCP as needed Recommended complete work up for amenorrhea  Tawnya Crook 06/09/2012, 10:11 PM

## 2012-06-09 NOTE — MAU Note (Signed)
Pt reports "really bad pains in my lower back and pains in my lower stomach" for about one month. Having headache off/on

## 2012-06-10 LAB — GC/CHLAMYDIA PROBE AMP
CT Probe RNA: NEGATIVE
GC Probe RNA: NEGATIVE

## 2012-06-10 NOTE — MAU Provider Note (Signed)
Attestation of Attending Supervision of Advanced Practitioner (CNM/NP): Evaluation and management procedures were performed by the Advanced Practitioner under my supervision and collaboration.  I have reviewed the Advanced Practitioner's note and chart, and I agree with the management and plan.  HARRAWAY-SMITH, Danis [place] 12:03 AM     

## 2012-07-07 ENCOUNTER — Encounter (HOSPITAL_COMMUNITY): Payer: Self-pay | Admitting: *Deleted

## 2012-07-07 ENCOUNTER — Inpatient Hospital Stay (HOSPITAL_COMMUNITY)
Admission: AD | Admit: 2012-07-07 | Discharge: 2012-07-07 | Disposition: A | Discharge status: Home / Self Care | Payer: 59 | Source: Ambulatory Visit | Attending: Obstetrics and Gynecology | Admitting: Obstetrics and Gynecology

## 2012-07-07 DIAGNOSIS — M545 Low back pain, unspecified: Secondary | ICD-10-CM

## 2012-07-07 DIAGNOSIS — N926 Irregular menstruation, unspecified: Secondary | ICD-10-CM | POA: Insufficient documentation

## 2012-07-07 LAB — URINALYSIS, ROUTINE W REFLEX MICROSCOPIC
Bilirubin Urine: NEGATIVE
Hgb urine dipstick: NEGATIVE
Protein, ur: NEGATIVE mg/dL
Specific Gravity, Urine: 1.025 (ref 1.005–1.030)
Urobilinogen, UA: 1 mg/dL (ref 0.0–1.0)

## 2012-07-07 MED ORDER — NAPROXEN 375 MG PO TABS: 375.0000 mg | ORAL_TABLET | Freq: Two times a day (BID) | ORAL | Status: DC

## 2012-07-07 MED ORDER — CYCLOBENZAPRINE HCL 10 MG PO TABS: 10.0000 mg | ORAL_TABLET | Freq: Three times a day (TID) | ORAL | Status: DC | PRN

## 2012-07-07 NOTE — MAU Note (Signed)
Patient states she has had back pain for 3 days.  Statess she skipped her March period and the one in April was heavier than usual. Not bleeding at this time.

## 2012-07-07 NOTE — MAU Provider Note (Signed)
History     CSN: [id number]  Arrival date and time: 07/07/12 1712   None     Chief Complaint  Patient presents with  . Back Pain   HPI 37 y.o. non-pregnant female with lower back pain and heavy/irregular uterine bleeding. LMP 4/26-4/30. Not bleeding today. No period in March. Seen in MAU 4/3 for abdominal and back pain. Normal pelvic/transvag ultrasound. Referred to OB-gyn for work-up of irregular menses. Back pain since early April per pt. When probed, has had similar symptoms off and on since 2009. Was told it was muscle spasm. Originally occurred after lifting some heavy boxes. Pain is bilateral, lower back (lumbar area). Denies radiation, numbness, tingling, weakness or change in bowel/bladder habits. Better when she walks for a while. Worse with bending forward, standing for prolonged periods. Has been taking OTC meds without much relief. Aleve helped some.    OB History   Grav Para Term Preterm Abortions TAB SAB Ect Mult Living   8    3 2  1  4       Past Medical History  Diagnosis Date  . Pregnancy, ectopic   . BV (bacterial vaginosis)   . Gonorrhea   . Chlamydia     Past Surgical History  Procedure Laterality Date  . Ectopic pregnancy surgery    . Dilation and curettage of uterus    Ganglion cyst in right leg removed - calf.  Family History  Problem Relation Age of Onset  . Diabetes Maternal Grandmother   . Heart disease Maternal Grandmother     History  Substance Use Topics  . Smoking status: Former Smoker    Types: Cigarettes  . Smokeless tobacco: Never Used  . Alcohol Use: Yes     Comment: occasionally    Allergies: No Known Allergies  Prescriptions prior to admission  Medication Sig Dispense Refill  . acetaminophen (TYLENOL) 325 MG tablet Take 650 mg by mouth daily as needed for pain.      . Aspirin-Salicylamide-Caffeine (BC HEADACHE POWDER PO) Take 1 Package by mouth daily as needed (for pain).        ROS  Pertinent pos and neg listed in  HPI   Physical Exam   Blood pressure 126/68, pulse 98, temperature 98.7 F (37.1 C), temperature source Oral, resp. rate 20, height 5\' 6"  (1.676 m), weight 113.49 kg (250 lb 3.2 oz), last menstrual period 07/02/2012, SpO2 100.00%.  Physical Exam  Constitutional: She is oriented to person, place, and time. She appears well-developed and well-nourished.  obese  HENT:  Head: Normocephalic and atraumatic.  Eyes: Conjunctivae and EOM are normal.  Neck: Normal range of motion. Neck supple.  Cardiovascular: Normal rate, regular rhythm and normal heart sounds.   Respiratory: Effort normal and breath sounds normal. No respiratory distress.  GI: Soft. Bowel sounds are normal. There is no tenderness. There is no rebound and no guarding.  Musculoskeletal: Normal range of motion. She exhibits no edema and no tenderness.  Neurological: She is alert and oriented to person, place, and time. She has normal reflexes. She displays normal reflexes. She exhibits normal muscle tone.  Strength 5/5 and equal bilaterally upper and lower extremities. Sensation grossly intact and equal bilaterally.  Skin: Skin is warm and dry.  Psychiatric: She has a normal mood and affect.   Results for orders placed during the hospital encounter of 07/07/12 (from the past 48 hour(s))  URINALYSIS, ROUTINE W REFLEX MICROSCOPIC     Status: None   Collection Time  07/07/12  5:32 PM      Result Value Range   Color, Urine YELLOW  YELLOW   APPearance CLEAR  CLEAR   Specific Gravity, Urine 1.025  1.005 - 1.030   pH 6.0  5.0 - 8.0   Glucose, UA NEGATIVE  NEGATIVE mg/dL   Hgb urine dipstick NEGATIVE  NEGATIVE   Bilirubin Urine NEGATIVE  NEGATIVE   Ketones, ur NEGATIVE  NEGATIVE mg/dL   Protein, ur NEGATIVE  NEGATIVE mg/dL   Urobilinogen, UA 1.0  0.0 - 1.0 mg/dL   Nitrite NEGATIVE  NEGATIVE   Leukocytes, UA NEGATIVE  NEGATIVE   Comment: MICROSCOPIC NOT DONE ON URINES WITH NEGATIVE PROTEIN, BLOOD, LEUKOCYTES, NITRITE, OR  GLUCOSE <1000 mg/dL.  POCT PREGNANCY, URINE     Status: None   Collection Time    07/07/12  5:36 PM      Result Value Range   Preg Test, Ur NEGATIVE  NEGATIVE   Comment:            THE SENSITIVITY OF THIS     METHODOLOGY IS >24 mIU/mL     MAU Course  Procedures   Assessment and Plan  37 y.o. female with irregular menses and low back pain - follow up with OB/Gyn as scheduled  - Lumbar back strain:  Back exercises, weight loss, flexeril, naproxen/tylenol, heat/ice - No signs of neurologic deficit or radicular symptoms - Stable for discharge home  Napoleon Form 07/07/2012, 7:08 PM

## 2012-07-08 NOTE — MAU Provider Note (Signed)
Attestation of Attending Supervision of Advanced Practitioner (CNM/NP): Evaluation and management procedures were performed by the Advanced Practitioner under my supervision and collaboration.  I have reviewed the Advanced Practitioner's note and chart, and I agree with the management and plan.  Annick Dimaio 07/08/2012 7:52 AM   

## 2012-07-26 IMAGING — US US OB TRANSVAGINAL MODIFY
1 series · 14 of 28 positions shown · non-contrast
Comparison: none

CLINICAL DATA: Lower abdominal pain.  Back pain.

OBSTETRIC <14 WK US AND TRANSVAGINAL OB US
TECHNIQUE: Both transabdominal and transvaginal ultrasound
examinations were performed for complete evaluation of the
gestation as well as the maternal uterus, adnexal regions, and
pelvic cul-de-sac.

[Series 1: us ob transvaginal modify · 0.31mm/px · 14 of 44 slices shown]
[im 2/44]
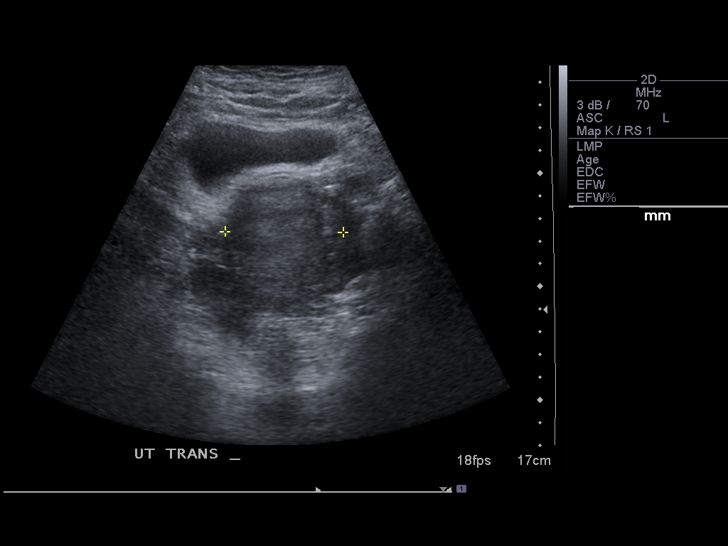
[im 5/44]
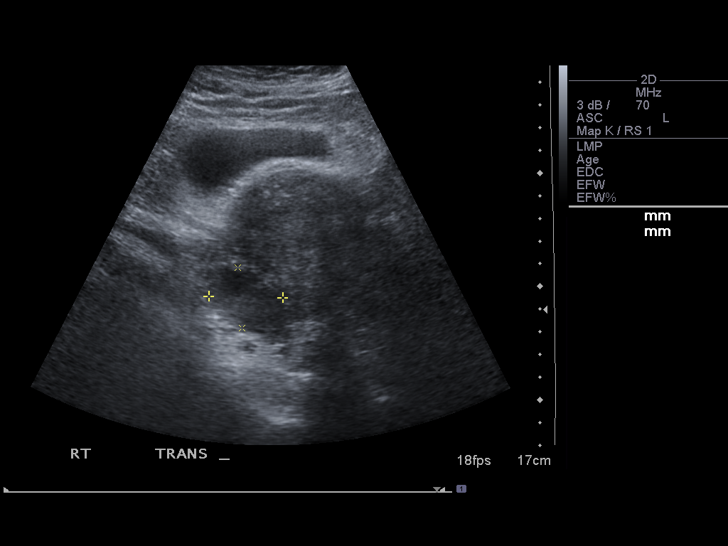
[im 8/44]
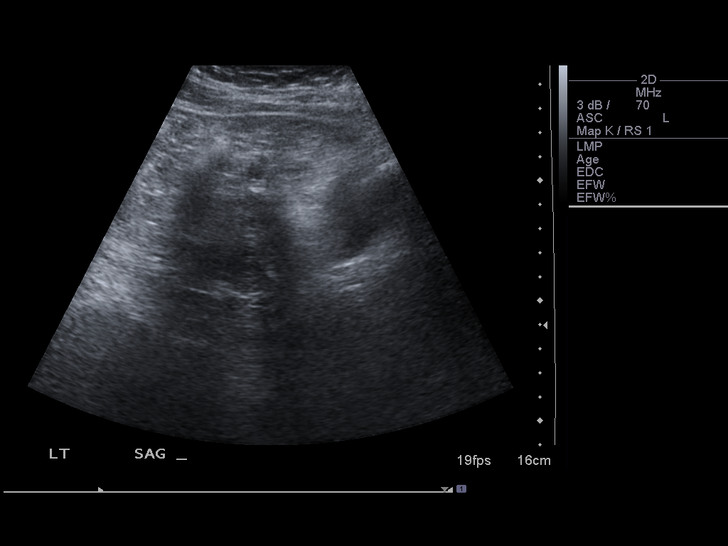
[im 12/44]
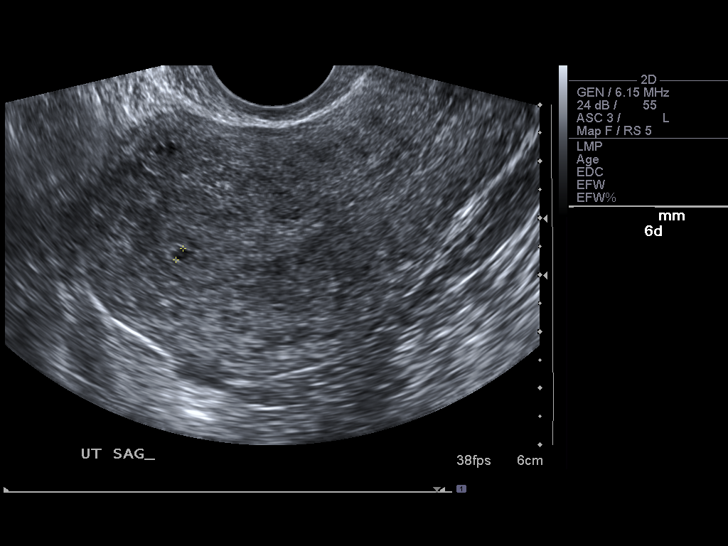
[im 15/44]
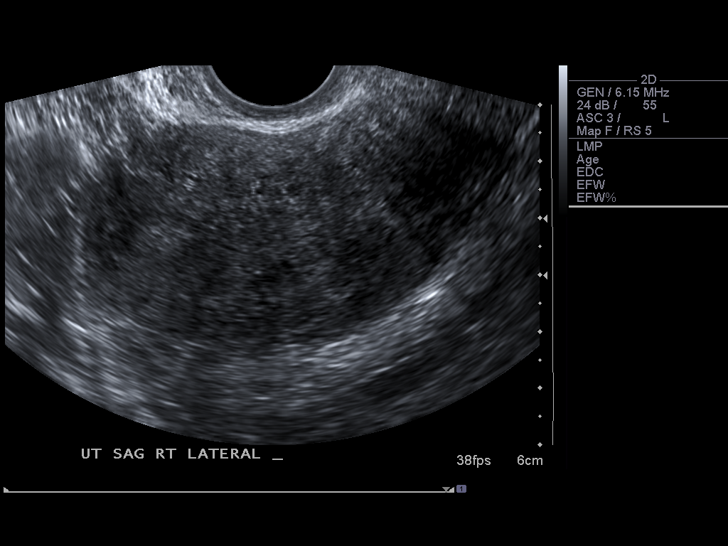
[im 18/44]
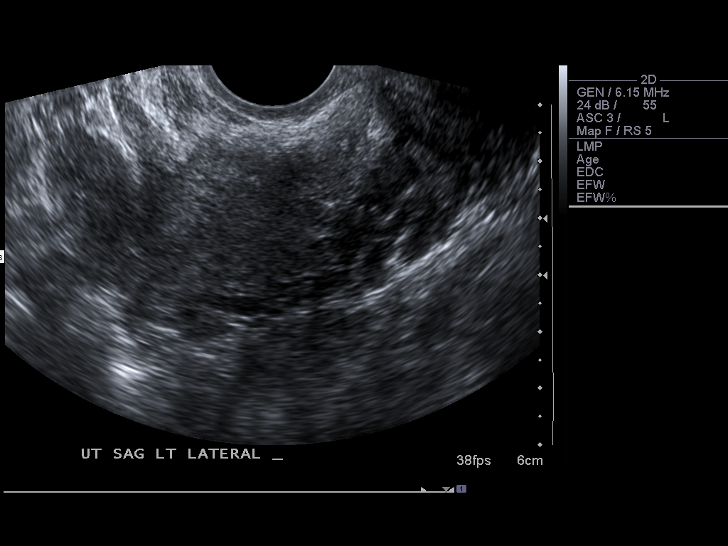
[im 21/44]
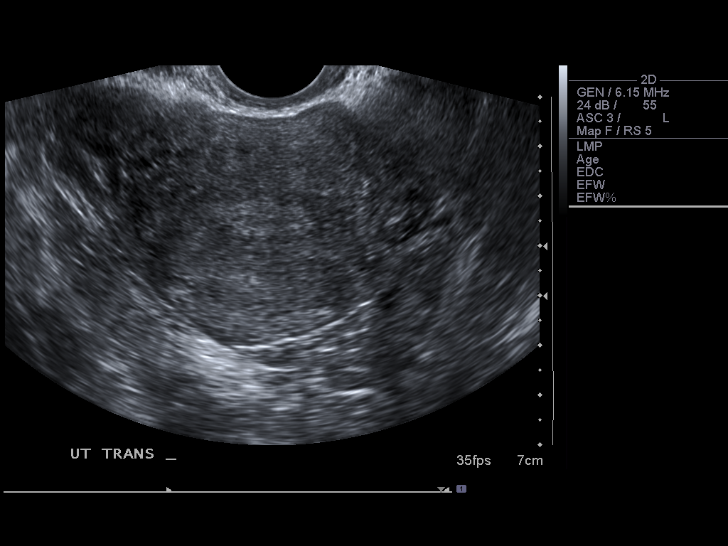
[im 24/44]
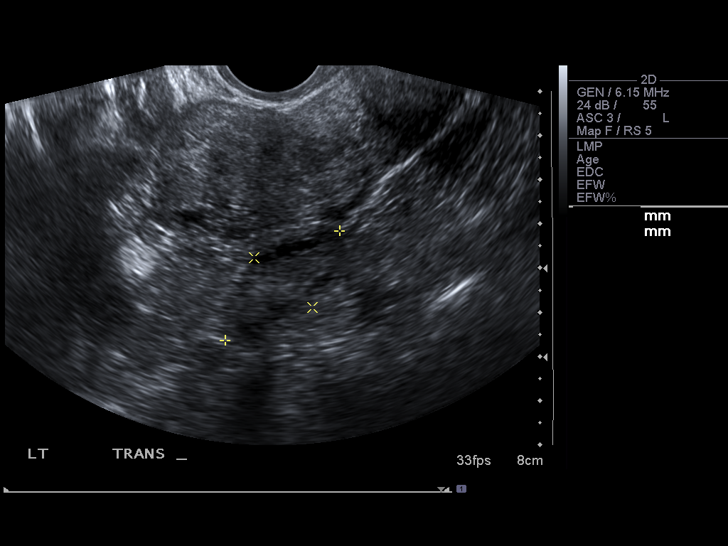
[im 28/44]
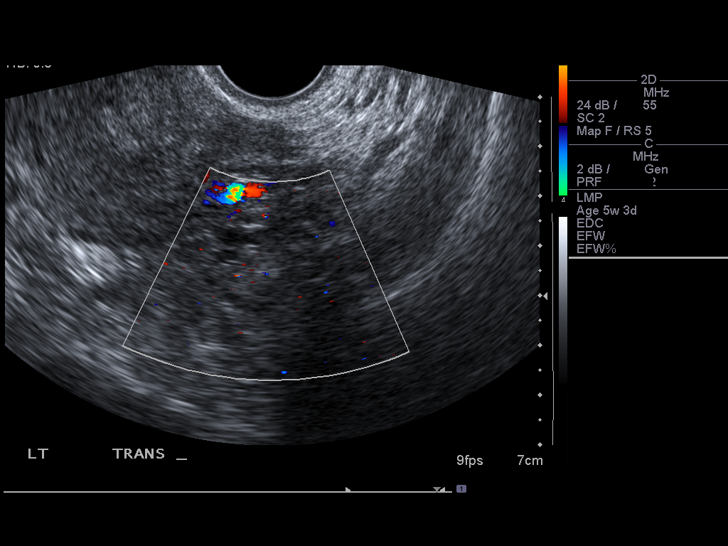
[im 31/44]
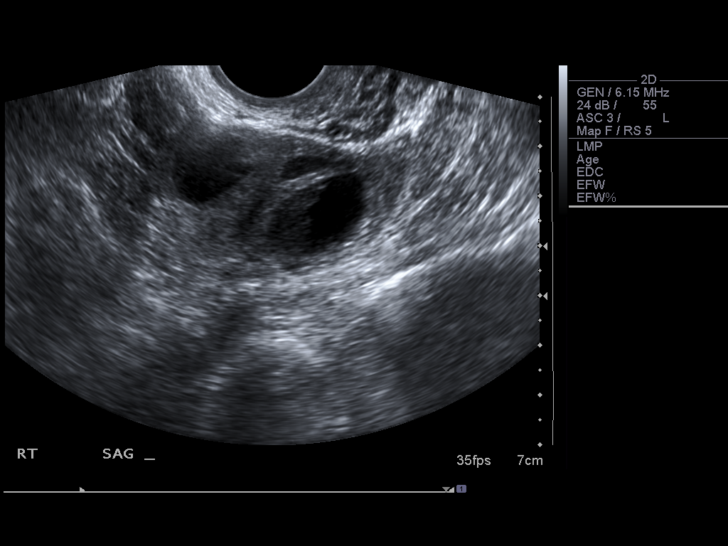
[im 34/44]
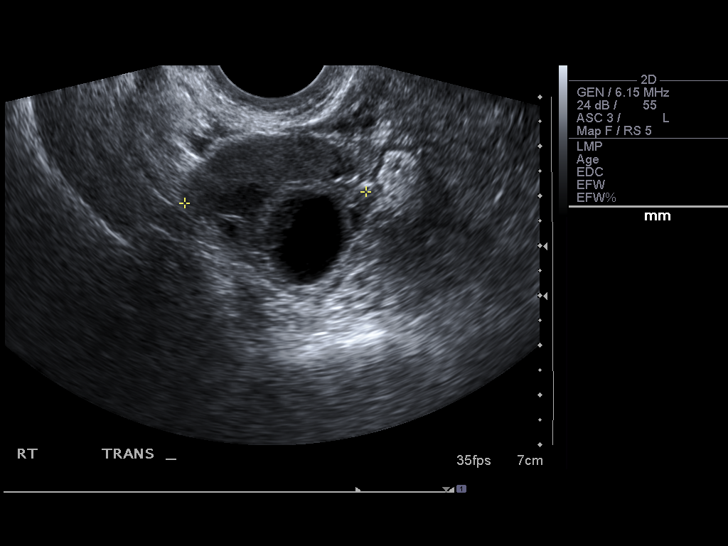
[im 37/44]
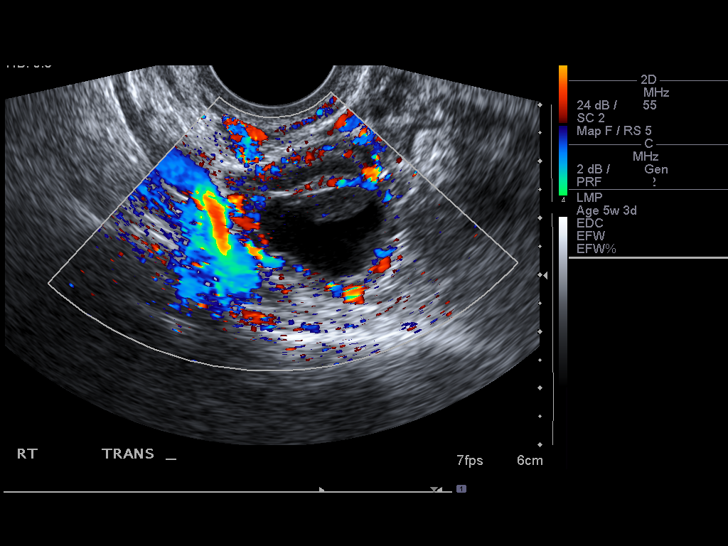
[im 40/44]
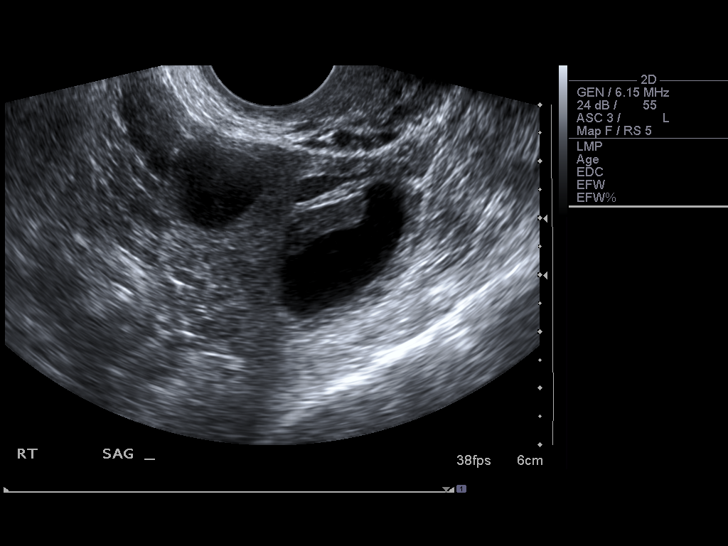
[im 44/44]
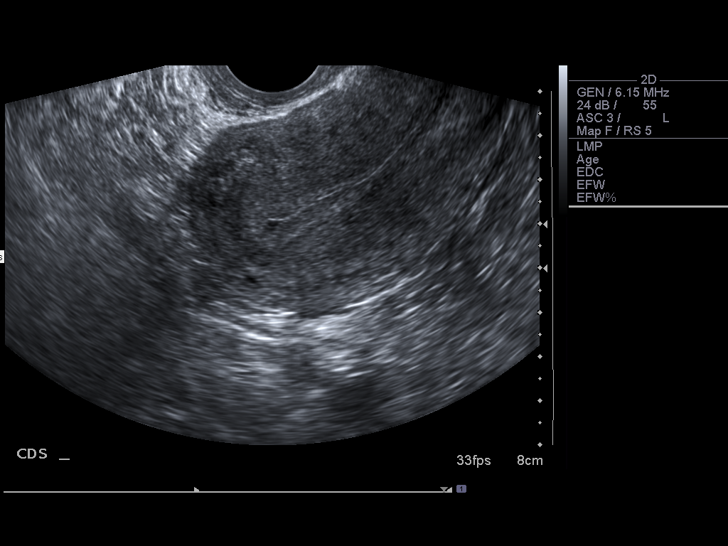

[14 of 28 positions shown; findings below may reference images not displayed]

FINDINGS: Quantitative beta HCG is 5217.

There is no yolk sac, embryo, or cardiac activity.  Tiny cystic
lesion is present within the endometrium in the fundus however
there is no embryo or yolk sac identified.  There is no cardiac
activity.

The  right ovary measures 3.6 x 1.7 x 1.7 cm.  Left ovary measures
4.4 x 3.9 x 3.7 cm.  There is a dominant cyst with mural material
probably representing retracted clot.  No yolk sac or embryo is
identified in the left adnexa.  There is no peripheral vascular
flow that would suggest ectopic or adnexal pregnancy.

Small amount of simple free fluid is present in the pouch of
[place].
IMPRESSION: Findings suspicious for ectopic pregnancy.  No adnexal masses with
a typical ectopic pregnancy appearance.  Tiny cystic lesion in the
endometrium probably represent pseudo gestational sac.

## 2012-07-27 IMAGING — US US OB TRANSVAGINAL
1 series · 14 of 28 positions shown · non-contrast
Comparison: none

OBSTETRICAL ULTRASOUND:
 This ultrasound exam was performed in the [HOSPITAL] Ultrasound Department.  The OB US report was generated in the [facility] system, and faxed to the ordering physician.  This report is also available in [HOSPITAL]?s [employer] and in [REDACTED] PACS.

[Series 1: us ob transvaginal · 0.12mm/px · 14 of 54 slices shown]
[im 2/54]
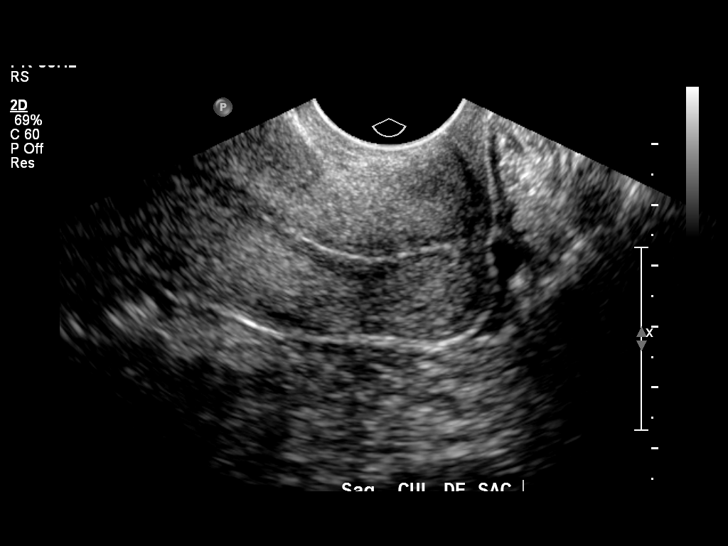
[im 6/54]
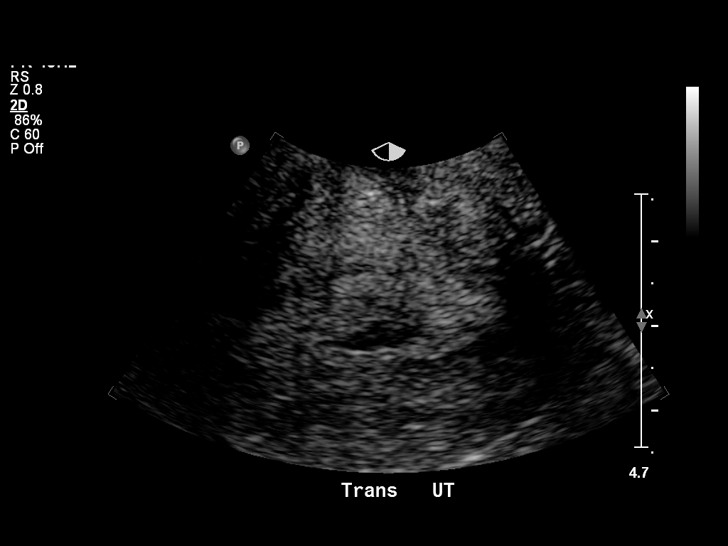
[im 10/54]
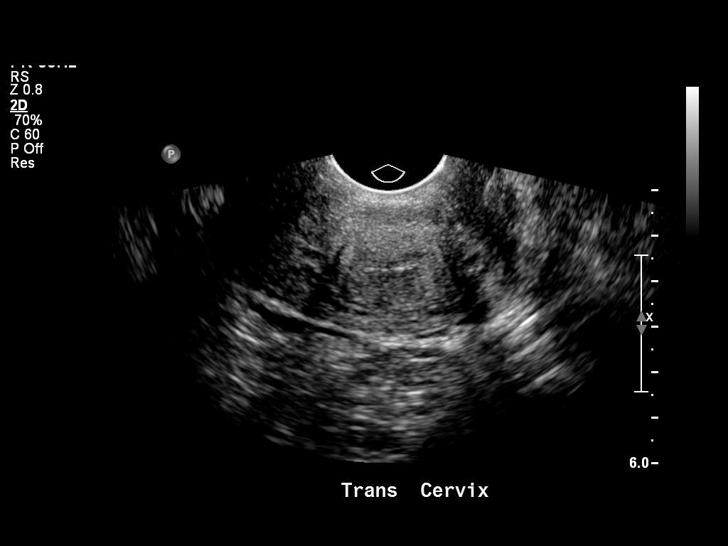
[im 14/54]
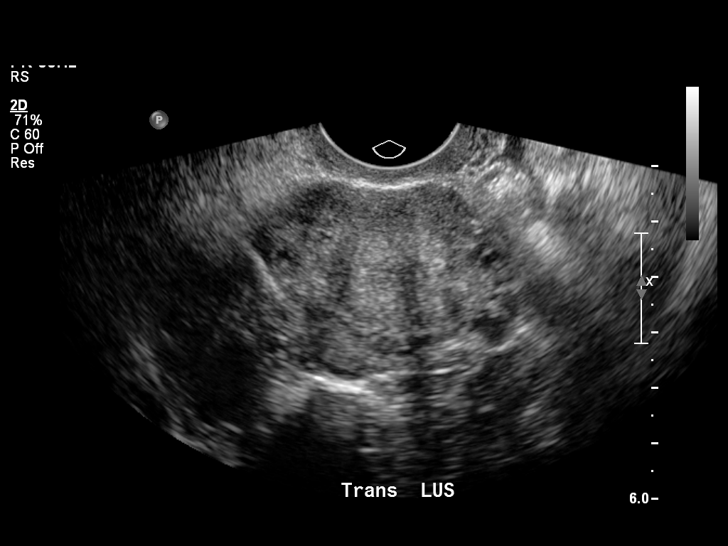
[im 18/54]
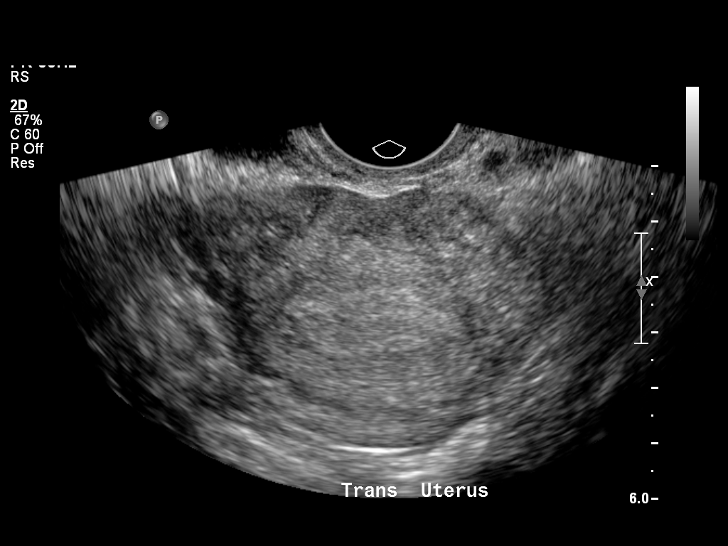
[im 22/54]
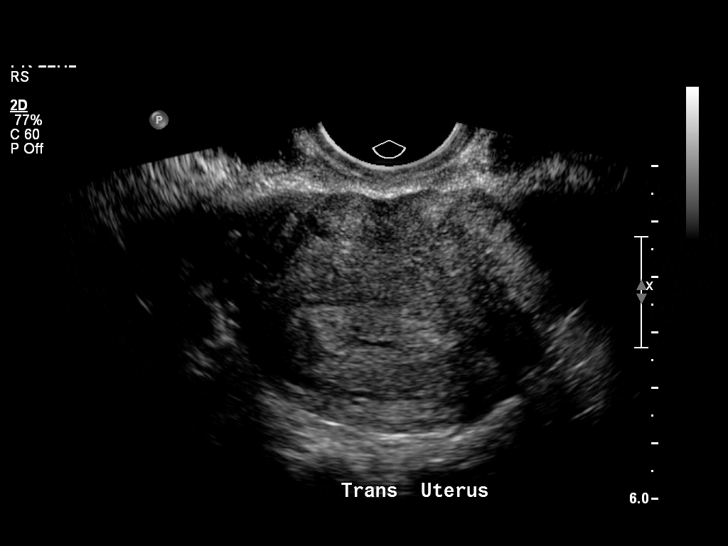
[im 26/54]
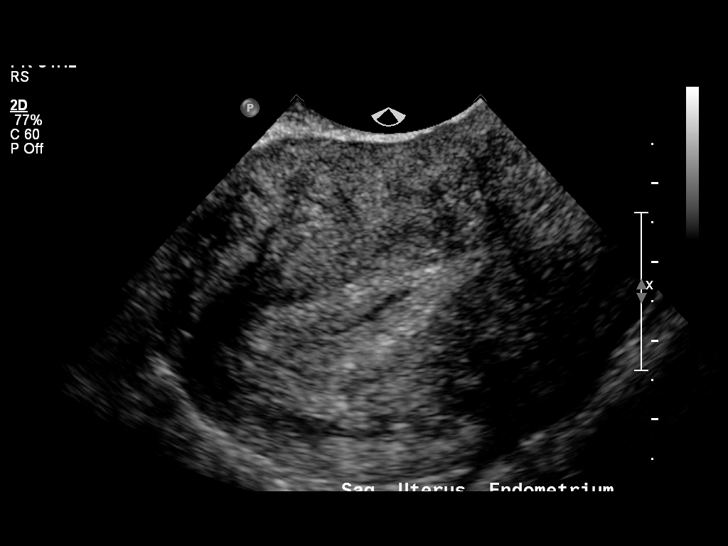
[im 30/54]
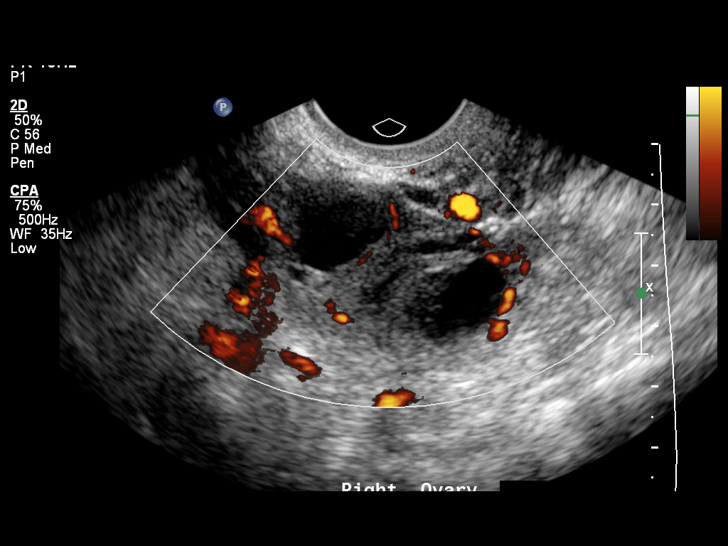
[im 34/54]
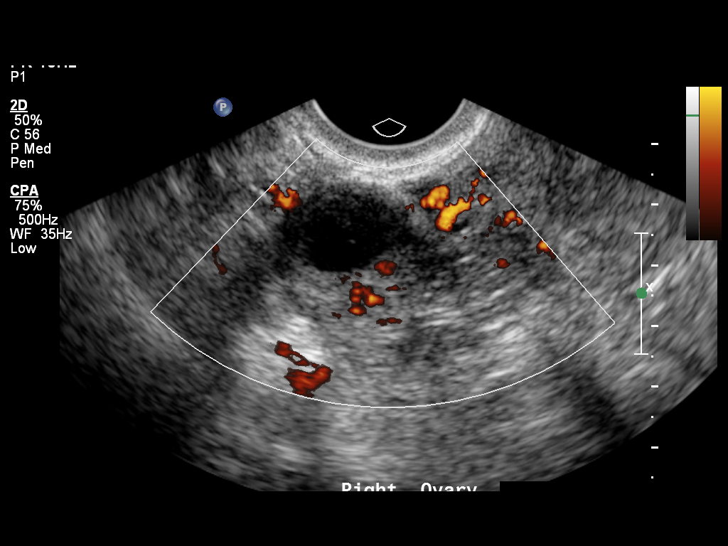
[im 38/54]
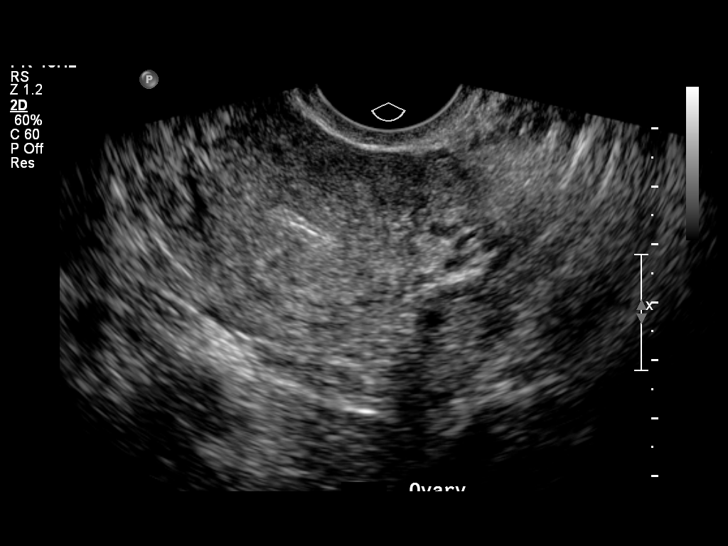
[im 42/54]
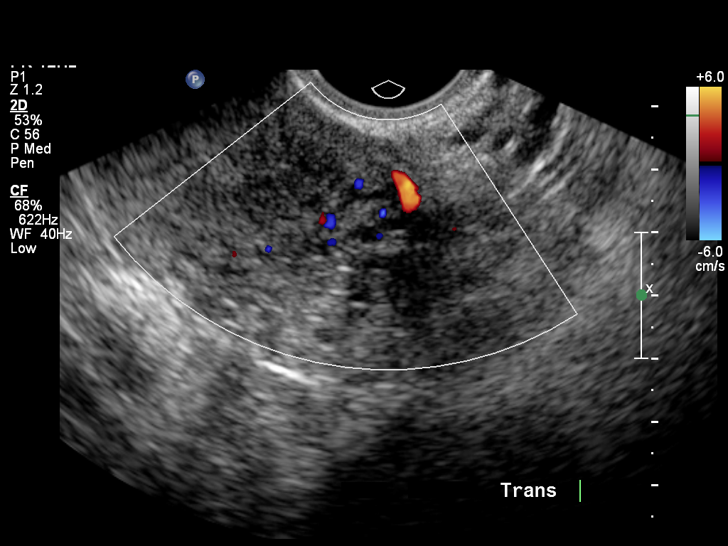
[im 46/54]
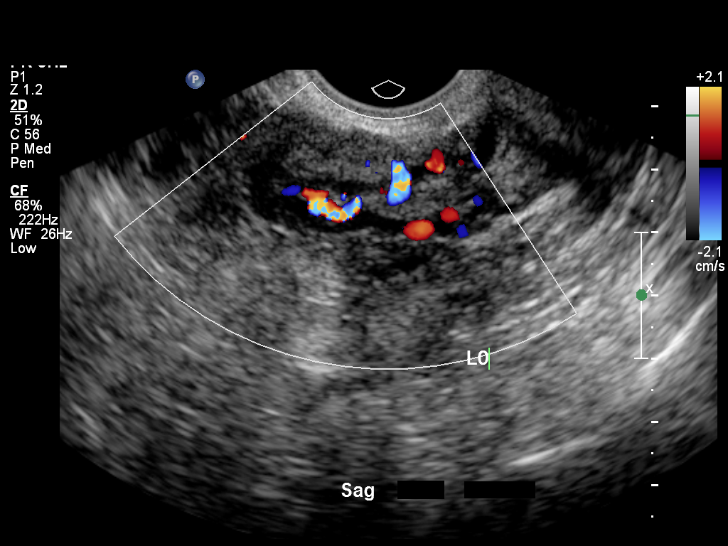
[im 50/54]
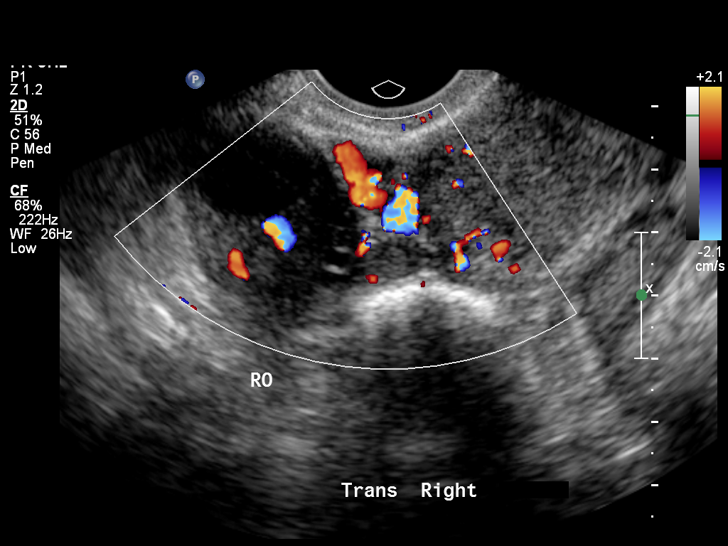
[im 54/54]
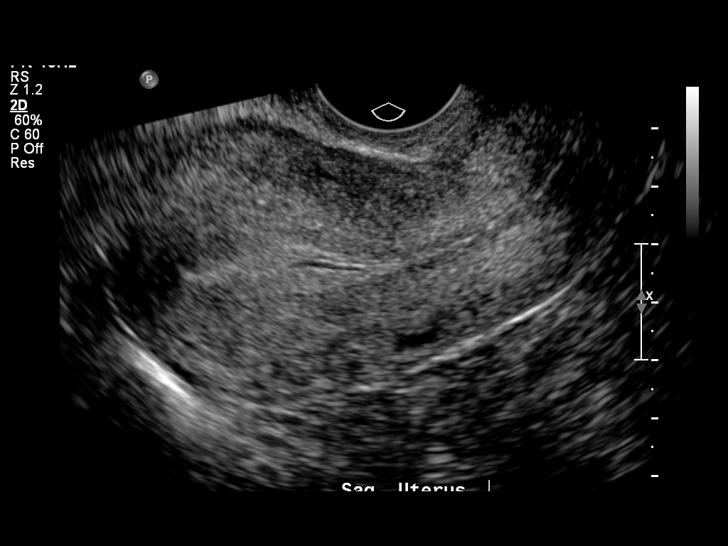

[14 of 28 positions shown; findings below may reference images not displayed]

IMPRESSION: See [facility] Obstetric US report.

## 2012-10-06 ENCOUNTER — Encounter (HOSPITAL_COMMUNITY): Payer: Self-pay | Admitting: *Deleted

## 2012-10-06 ENCOUNTER — Emergency Department (INDEPENDENT_AMBULATORY_CARE_PROVIDER_SITE_OTHER)
Admission: EM | Admit: 2012-10-06 | Discharge: 2012-10-06 | Disposition: A | Discharge status: Home / Self Care | Payer: 59 | Source: Home / Self Care | Attending: Family Medicine | Admitting: Family Medicine

## 2012-10-06 DIAGNOSIS — B9689 Other specified bacterial agents as the cause of diseases classified elsewhere: Secondary | ICD-10-CM

## 2012-10-06 DIAGNOSIS — K029 Dental caries, unspecified: Secondary | ICD-10-CM

## 2012-10-06 MED ORDER — CLINDAMYCIN HCL 300 MG PO CAPS: 300.0000 mg | ORAL_CAPSULE | Freq: Three times a day (TID) | ORAL | Status: DC

## 2012-10-06 MED ORDER — DICLOFENAC POTASSIUM 50 MG PO TABS: 50.0000 mg | ORAL_TABLET | Freq: Three times a day (TID) | ORAL | Status: DC

## 2012-10-06 NOTE — ED Provider Notes (Addendum)
CSN: [id number]     Arrival date & time 10/06/12  1934 History     First MD Initiated Contact with Patient 10/06/12 1958     Chief Complaint  Patient presents with  . Vaginal Itching  . Dental Pain   (Consider location/radiation/quality/duration/timing/severity/associated sxs/prior Treatment) Patient is a 37 y.o. female presenting with tooth pain. The history is provided by the patient.  Dental Pain Location:  Lower Lower teeth location:  30/RL 1st molar Quality:  Pulsating and throbbing Severity:  Moderate Onset quality:  Gradual Duration:  2 months Progression:  Worsening Chronicity:  Recurrent Context: dental caries   Context comment:  Reports seeing dentist for same and given amox and ibup, but unable to make f/u appt  and tooth has begun to hurt again Associated symptoms: no facial swelling and no fever     Past Medical History  Diagnosis Date  . Pregnancy, ectopic   . BV (bacterial vaginosis)   . Gonorrhea   . Chlamydia    Past Surgical History  Procedure Laterality Date  . Ectopic pregnancy surgery    . Dilation and curettage of uterus     Family History  Problem Relation Age of Onset  . Diabetes Maternal Grandmother   . Heart disease Maternal Grandmother    History  Substance Use Topics  . Smoking status: Former Smoker    Types: Cigarettes    Quit date: 08/05/2012  . Smokeless tobacco: Never Used  . Alcohol Use: Yes     Comment: occasionally   OB History   Grav Para Term Preterm Abortions TAB SAB Ect Mult Living   8    3 2  1  4      Review of Systems  Constitutional: Negative.  Negative for fever.  HENT: Positive for dental problem. Negative for facial swelling.   Hematological: Negative for adenopathy.    Allergies  Review of patient's allergies indicates no known allergies.  Home Medications   Current Outpatient Rx  Name  Route  Sig  Dispense  Refill  . ibuprofen (ADVIL,MOTRIN) 600 MG tablet   Oral   Take 600 mg by mouth every 6 (six)  hours as needed for pain.         Marland Kitchen acetaminophen (TYLENOL) 325 MG tablet   Oral   Take 650 mg by mouth daily as needed for pain.         . clindamycin (CLEOCIN) 300 MG capsule   Oral   Take 1 capsule (300 mg total) by mouth 3 (three) times daily.   21 capsule   0   . cyclobenzaprine (FLEXERIL) 10 MG tablet   Oral   Take 1 tablet (10 mg total) by mouth 3 (three) times daily as needed for muscle spasms.   30 tablet   1   . diclofenac (CATAFLAM) 50 MG tablet   Oral   Take 1 tablet (50 mg total) by mouth 3 (three) times daily. For dental pain   15 tablet   1   . naproxen (NAPROSYN) 375 MG tablet   Oral   Take 1 tablet (375 mg total) by mouth 2 (two) times daily with a meal.   30 tablet   1    BP 114/68  Pulse 91  Temp(Src) 98.4 F (36.9 C) (Oral)  Resp 16  SpO2 100%  LMP 09/09/2012 Physical Exam  Nursing note and vitals reviewed. Constitutional: She is oriented to person, place, and time. She appears well-developed and well-nourished. She appears distressed.  HENT:  Mouth/Throat: Abnormal dentition. Dental abscesses and dental caries present.    Neck: Normal range of motion. Neck supple.  Lymphadenopathy:    She has no cervical adenopathy.  Neurological: She is alert and oriented to person, place, and time.  Skin: Skin is warm and dry.    ED Course   Procedures (including critical care time)  Labs Reviewed - No data to display No results found. 1. Pain due to dental caries   2. BV (bacterial vaginosis)     MDM    Linna Hoff, MD 10/06/12 2022  Linna Hoff, MD 10/06/12 2023

## 2012-10-06 NOTE — ED Notes (Signed)
C/o vaginal itching and creamy discharge onset last Sat. with a fishy odor.  C/o toothache since 6/4 R lower jaw.  She has a Education officer, community. Has appt. 8/18.  She took a round of antibiotics and Ibuprofen 600 mg.  Has to wait to get an extraction.

## 2012-12-31 ENCOUNTER — Emergency Department (HOSPITAL_COMMUNITY)
Admission: EM | Admit: 2012-12-31 | Discharge: 2012-12-31 | Disposition: A | Discharge status: Home / Self Care | Payer: 59 | Attending: Emergency Medicine | Admitting: Emergency Medicine

## 2012-12-31 ENCOUNTER — Encounter (HOSPITAL_COMMUNITY): Payer: Self-pay | Admitting: Emergency Medicine

## 2012-12-31 DIAGNOSIS — Z8742 Personal history of other diseases of the female genital tract: Secondary | ICD-10-CM | POA: Insufficient documentation

## 2012-12-31 DIAGNOSIS — Z8619 Personal history of other infectious and parasitic diseases: Secondary | ICD-10-CM | POA: Insufficient documentation

## 2012-12-31 DIAGNOSIS — K0889 Other specified disorders of teeth and supporting structures: Secondary | ICD-10-CM

## 2012-12-31 DIAGNOSIS — Z87891 Personal history of nicotine dependence: Secondary | ICD-10-CM | POA: Insufficient documentation

## 2012-12-31 DIAGNOSIS — K089 Disorder of teeth and supporting structures, unspecified: Secondary | ICD-10-CM | POA: Insufficient documentation

## 2012-12-31 MED ORDER — PENICILLIN V POTASSIUM 500 MG PO TABS: 500.0000 mg | ORAL_TABLET | Freq: Three times a day (TID) | ORAL | Status: DC

## 2012-12-31 MED ORDER — HYDROCODONE-ACETAMINOPHEN 5-325 MG PO TABS: 1.0000 | ORAL_TABLET | ORAL | Status: DC | PRN

## 2012-12-31 NOTE — ED Provider Notes (Signed)
CSN: [id number]     Arrival date & time 12/31/12  1516 History  This chart was scribed for non-physician practitioner Marlon Pel, PA-C, working with Roney Marion, MD by Dorothey Baseman, ED Scribe. This patient was seen in room TR08C/TR08C and the patient's care was started at 4:25 PM.    Chief Complaint  Patient presents with  . Dental Pain   The history is provided by the patient. No language interpreter was used.   HPI Comments: Victoria Rivers is a 37 y.o. female who presents to the Emergency Department complaining of right-sided, upper and lower dental pain onset 3 months ago. Patient reports that she was seen for similar complaints and given antibiotics and pain medications, which she has since run out of. Patient reports that she had an appointment to have her teeth extracted today, but that the provider was unable to perform it and she has not been able to make an additional appointment to follow up. She reports associated facial swelling. Patient denies fever. Patient denies other pertinent medical history.   Past Medical History  Diagnosis Date  . Pregnancy, ectopic   . BV (bacterial vaginosis)   . Gonorrhea   . Chlamydia    Past Surgical History  Procedure Laterality Date  . Ectopic pregnancy surgery    . Dilation and curettage of uterus     Family History  Problem Relation Age of Onset  . Diabetes Maternal Grandmother   . Heart disease Maternal Grandmother    History  Substance Use Topics  . Smoking status: Former Smoker    Types: Cigarettes    Quit date: 08/05/2012  . Smokeless tobacco: Never Used  . Alcohol Use: Yes     Comment: occasionally   OB History   Grav Para Term Preterm Abortions TAB SAB Ect Mult Living   8    3 2  1  4      Review of Systems  Constitutional: Negative for fever.  HENT: Positive for dental problem and facial swelling.     Allergies  Review of patient's allergies indicates no known allergies.  Home Medications   Current Outpatient  Rx  Name  Route  Sig  Dispense  Refill  . acetaminophen (TYLENOL) 325 MG tablet   Oral   Take 650 mg by mouth daily as needed for pain.         . Aspirin-Salicylamide-Caffeine (BC HEADACHE POWDER PO)   Oral   Take 1 packet by mouth once.         Marland Kitchen ibuprofen (ADVIL,MOTRIN) 200 MG tablet   Oral   Take 800 mg by mouth daily as needed for pain.         Marland Kitchen HYDROcodone-acetaminophen (NORCO/VICODIN) 5-325 MG per tablet   Oral   Take 1-2 tablets by mouth every 4 (four) hours as needed for pain.   20 tablet   0   . penicillin v potassium (VEETID) 500 MG tablet   Oral   Take 1 tablet (500 mg total) by mouth 3 (three) times daily.   30 tablet   0    Triage Vitals: BP 115/80  Pulse 95  Temp(Src) 98.2 F (36.8 C) (Oral)  Resp 20  SpO2 97%  Physical Exam  Nursing note and vitals reviewed. Constitutional: She is oriented to person, place, and time. She appears well-developed and well-nourished. No distress.  HENT:  Head: Normocephalic and atraumatic.  Mouth/Throat: Uvula is midline, oropharynx is clear and moist and mucous membranes are normal. Normal  dentition. Dental caries (Pts tooth shows no obvious abscess but moderate to severe tenderness to palpation of marked tooth) present. No uvula swelling.  Eyes: Conjunctivae are normal. Pupils are equal, round, and reactive to light.  Neck: Trachea normal, normal range of motion and full passive range of motion without pain. Neck supple.  Cardiovascular: Normal rate, regular rhythm, normal heart sounds and normal pulses.   Pulmonary/Chest: Effort normal and breath sounds normal. No respiratory distress. Chest wall is not dull to percussion. She exhibits no tenderness, no crepitus, no edema, no deformity and no retraction.  Abdominal: Normal appearance. She exhibits no distension.  Musculoskeletal: Normal range of motion.  Neurological: She is alert and oriented to person, place, and time. She has normal strength.  Skin: Skin is warm,  dry and intact. She is not diaphoretic.  Psychiatric: She has a normal mood and affect. Her speech is normal and behavior is normal. Cognition and memory are normal.    ED Course  Procedures (including critical care time)  DIAGNOSTIC STUDIES: Oxygen Saturation is 97% on room air, normal by my interpretation.    COORDINATION OF CARE: 4:27 PM- Advised patient to make an appointment for the tooth extraction. Will discharge patient with antibiotics and pain medications. Discussed treatment plan with patient at bedside and patient verbalized agreement.     Labs Review Labs Reviewed - No data to display Imaging Review No results found.  EKG Interpretation   None       MDM   1. Toothache    Periodontal Exam   Patient has dental pain. No emergent s/sx's present. Patent airway. No trismus.  Will be given pain medication and antibiotics. I discussed the need to call dentist within 24/48 hours for follow-up. Dental referral given. Return to ED precautions given.  Pt voiced understanding and has agreed to follow-up.   37 y.o.Victoria Rivers's evaluation in the Emergency Department is complete. It has been determined that no acute conditions requiring further emergency intervention are present at this time. The patient/guardian have been advised of the diagnosis and plan. We have discussed signs and symptoms that warrant return to the ED, such as changes or worsening in symptoms.  Vital signs are stable at discharge. Filed Vitals:   12/31/12 1519  BP: 115/80  Pulse: 95  Temp: 98.2 F (36.8 C)  Resp: 20    Patient/guardian has voiced understanding and agreed to follow-up with the PCP or specialist.  I personally performed the services described in this documentation, which was scribed in my presence. The recorded information has been reviewed and is accurate.    Dorthula Matas, PA-C 12/31/12 1650

## 2012-12-31 NOTE — ED Notes (Signed)
Pt c/o severe R sided toothache for past several days, had appt with a dentist today but the dentist cancelled her appt. She has been taking OTC pain meds with no relief.

## 2013-01-06 NOTE — ED Provider Notes (Signed)
Medical screening examination/treatment/procedure(s) were performed by non-physician practitioner and as supervising physician I was immediately available for consultation/collaboration.  EKG Interpretation   None         Morissa Obeirne J Cory Rama, MD 01/06/13 2004 

## 2013-04-17 ENCOUNTER — Inpatient Hospital Stay (HOSPITAL_COMMUNITY)
Admission: AD | Admit: 2013-04-17 | Discharge: 2013-04-17 | Disposition: A | Discharge status: Home / Self Care | Payer: 59 | Source: Ambulatory Visit | Attending: Obstetrics and Gynecology | Admitting: Obstetrics and Gynecology

## 2013-04-17 ENCOUNTER — Encounter (HOSPITAL_COMMUNITY): Payer: Self-pay | Admitting: *Deleted

## 2013-04-17 DIAGNOSIS — N898 Other specified noninflammatory disorders of vagina: Secondary | ICD-10-CM | POA: Insufficient documentation

## 2013-04-17 DIAGNOSIS — R51 Headache: Secondary | ICD-10-CM | POA: Insufficient documentation

## 2013-04-17 DIAGNOSIS — M549 Dorsalgia, unspecified: Secondary | ICD-10-CM | POA: Diagnosis not present

## 2013-04-17 DIAGNOSIS — A5901 Trichomonal vulvovaginitis: Secondary | ICD-10-CM | POA: Insufficient documentation

## 2013-04-17 LAB — URINALYSIS, ROUTINE W REFLEX MICROSCOPIC
BILIRUBIN URINE: NEGATIVE
Glucose, UA: NEGATIVE mg/dL
HGB URINE DIPSTICK: NEGATIVE
Ketones, ur: NEGATIVE mg/dL
NITRITE: NEGATIVE
PROTEIN: NEGATIVE mg/dL
SPECIFIC GRAVITY, URINE: 1.02 (ref 1.005–1.030)
UROBILINOGEN UA: 0.2 mg/dL (ref 0.0–1.0)
pH: 6 (ref 5.0–8.0)

## 2013-04-17 LAB — URINE MICROSCOPIC-ADD ON

## 2013-04-17 LAB — WET PREP, GENITAL
CLUE CELLS WET PREP: NONE SEEN
Yeast Wet Prep HPF POC: NONE SEEN

## 2013-04-17 LAB — POCT PREGNANCY, URINE: Preg Test, Ur: NEGATIVE

## 2013-04-17 MED ORDER — METRONIDAZOLE 500 MG PO TABS: ORAL_TABLET | ORAL | Status: DC

## 2013-04-17 NOTE — Discharge Instructions (Signed)
Trichomoniasis Trichomoniasis is an infection, caused by the Trichomonas organism, that affects both women and men. In women, the outer female genitalia and the vagina are affected. In men, the penis is mainly affected, but the prostate and other reproductive organs can also be involved. Trichomoniasis is a sexually transmitted disease (STD) and is most often passed to another person through sexual contact. The majority of people who get trichomoniasis do so from a sexual encounter and are also at risk for other STDs. CAUSES   Sexual intercourse with an infected partner.  It can be present in swimming pools or hot tubs. SYMPTOMS   Abnormal gray-green frothy vaginal discharge in women.  Vaginal itching and irritation in women.  Itching and irritation of the area outside the vagina in women.  Penile discharge with or without pain in males.  Inflammation of the urethra (urethritis), causing painful urination.  Bleeding after sexual intercourse. RELATED COMPLICATIONS  Pelvic inflammatory disease.  Infection of the uterus (endometritis).  Infertility.  Tubal (ectopic) pregnancy.  It can be associated with other STDs, including gonorrhea and chlamydia, hepatitis B, and HIV. COMPLICATIONS DURING PREGNANCY  Early (premature) delivery.  Premature rupture of the membranes (PROM).  Low birth weight. DIAGNOSIS   Visualization of Trichomonas under the microscope from the vagina discharge.  Ph of the vagina greater than 4.5, tested with a test tape.  Trich Rapid Test.  Culture of the organism, but this is not usually needed.  It may be found on a Pap test.  Having a "strawberry cervix,"which means the cervix looks very red like a strawberry. TREATMENT   You may be given medication to fight the infection. Inform your caregiver if you could be or are pregnant. Some medications used to treat the infection should not be taken during pregnancy.  Over-the-counter medications or  creams to decrease itching or irritation may be recommended.  Your sexual partner will need to be treated if infected. HOME CARE INSTRUCTIONS   Take all medication prescribed by your caregiver.  Take over-the-counter medication for itching or irritation as directed by your caregiver.  Do not have sexual intercourse while you have the infection.  Do not douche or wear tampons.  Discuss your infection with your partner, as your partner may have acquired the infection from you. Or, your partner may have been the person who transmitted the infection to you.  Have your sex partner examined and treated if necessary.  Practice safe, informed, and protected sex.  See your caregiver for other STD testing. SEEK MEDICAL CARE IF:   You still have symptoms after you finish the medication.  You have an oral temperature above 102 F (38.9 C).  You develop belly (abdominal) pain.  You have pain when you urinate.  You have bleeding after sexual intercourse.  You develop a rash.  The medication makes you sick or makes you throw up (vomit). Document Released: 08/19/2000 Document Revised: 05/18/2011 Document Reviewed: 09/14/2008 [facility] Patient Information 2014 [place], [place].  Sexually Transmitted Disease A sexually transmitted disease (STD) is a disease or infection that may be passed (transmitted) from person to person, usually during sexual activity. This may happen by way of saliva, semen, blood, vaginal mucus, or urine. Common STDs include:   Gonorrhea.   Chlamydia.   Syphilis.   HIV and AIDS.   Genital herpes.   Hepatitis B and C.   Trichomonas.   Human papillomavirus (HPV).   Pubic lice.   Scabies.  Mites.  Bacterial vaginosis. WHAT ARE CAUSES OF  STDs? An STD may be caused by bacteria, a virus, or parasites. STDs are often transmitted during sexual activity if one person is infected. However, they may also be transmitted through nonsexual means. STDs  may be transmitted after:   Sexual intercourse with an infected person.   Sharing sex toys with an infected person.   Sharing needles with an infected person or using unclean piercing or tattoo needles.  Having intimate contact with the genitals, mouth, or rectal areas of an infected person.   Exposure to infected fluids during birth. WHAT ARE THE SIGNS AND SYMPTOMS OF STDs? Different STDs have different symptoms. Some people may not have any symptoms. If symptoms are present, they may include:   Painful or bloody urination.   Pain in the pelvis, abdomen, vagina, anus, throat, or eyes.   Skin rash, itching, irritation, growths, sores (lesions), ulcerations, or warts in the genital or anal area.  Abnormal vaginal discharge with or without bad odor.   Penile discharge in men.   Fever.   Pain or bleeding during sexual intercourse.   Swollen glands in the groin area.   Yellow skin and eyes (jaundice). This is seen with hepatitis.   Swollen testicles.  Infertility.  Sores and blisters in the mouth. HOW ARE STDs DIAGNOSED? To make a diagnosis, your health care provider may:   Take a medical history.   Perform a physical exam.   Take a sample of any discharge for examination.  Swab the throat, cervix, opening to the penis, rectum, or vagina for testing.  Test a sample of your first morning urine.   Perform blood tests.   Perform a Pap smear, if this applies.   Perform a colposcopy.   Perform a laparoscopy.  HOW ARE STDs TREATED? Treatment depends on the STD. Some STDs may be treated but not cured.   Chlamydia, gonorrhea, trichomonas, and syphilis can be cured with antibiotics.   Genital herpes, hepatitis, and HIV can be treated, but not cured, with prescribed medicines. The medicines lessen symptoms.   Genital warts from HPV can be treated with medicine or by freezing, burning (electrocautery), or surgery. Warts may come back.   HPV  cannot be cured with medicine or surgery. However, abnormal areas may be removed from the cervix, vagina, or vulva.   If your diagnosis is confirmed, your recent sexual partners need treatment. This is true even if they are symptom-free or have a negative culture or evaluation. They should not have sex until their health care providers say it is OK. HOW CAN I REDUCE MY RISK OF GETTING AN STD?  Use latex condoms, dental dams, and water-soluble lubricants during sexual activity. Do not use petroleum jelly or oils.  Get vaccinated for HPV and hepatitis. If you have not received these vaccines in the past, talk to your health care provider about whether one or both might be right for you.   Avoid risky sex practices that can break the skin.  WHAT SHOULD I DO IF I THINK I HAVE AN STD?  See your health care provider.   Inform all sexual partners. They should be tested and treated for any STDs.  Do not have sex until your health care provider says it is OK. WHEN SHOULD I GET HELP? Seek immediate medical care if:  You develop severe abdominal pain.  You are a man and notice swelling or pain in the testicles.  You are a woman and notice swelling or pain in your vagina. Document Released: 05/16/2002  Document Revised: 12/14/2012 Document Reviewed: 09/13/2012 [facility] Patient Information 2014 [place], [place].

## 2013-04-17 NOTE — MAU Note (Signed)
Pt LMP 04/11/2013, which was light.  Lower back pain x 1 month, headache today. White vaginal discharge with an odor and itching.

## 2013-04-17 NOTE — MAU Provider Note (Signed)
CC: Back Pain, Vaginal Discharge and Headache    First Provider Initiated Contact with Patient 04/17/13 2032      HPI Victoria Rivers is a 38 y.o. [id number] who presents with onset 3 days ago of vaginal itch, increased discharge and irritaitve rash right thigh. Had unprotected intercourse with new sexual partner recently. Plans to get appointment with [facility] for contraception.  LMP 04/11/13. Denies abnormal bleeding. Also has mid LBP which she usually gets premenstrually. It has abated without treatment but not resolved. Has mild H/A. Declines analgesia tx for LBP or H/A and has meds at home. BC powder usually is effective. Not on PCN or Norco which she had for dental pain.   Past Medical History  Diagnosis Date  . Pregnancy, ectopic   . BV (bacterial vaginosis)   . Gonorrhea   . Chlamydia     OB History  Gravida Para Term Preterm AB SAB TAB Ectopic Multiple Living  8    3  2 1  4     # Outcome Date GA Lbr Len/2nd Weight Sex Delivery Anes PTL Lv  8 TAB           7 TAB           6 ECT           5 GRA              Comments: System Generated. Please review and update pregnancy details.  4 GRA           3 GRA           2 GRA           1 GRA               Past Surgical History  Procedure Laterality Date  . Ectopic pregnancy surgery    . Dilation and curettage of uterus      History   Social History  . Marital Status: Divorced    Spouse Name: N/A    Number of Children: N/A  . Years of Education: N/A   Occupational History  . Not on file.   Social History Main Topics  . Smoking status: Former Smoker    Types: Cigarettes    Quit date: 08/05/2012  . Smokeless tobacco: Never Used  . Alcohol Use: Yes     Comment: occasionally  . Drug Use: No  . Sexual Activity: Yes    Birth Control/ Protection: Condom   Other Topics Concern  . Not on file   Social History Narrative  . No narrative on file    No current facility-administered medications on file prior to  encounter.   Current Outpatient Prescriptions on File Prior to Encounter  Medication Sig Dispense Refill  . acetaminophen (TYLENOL) 325 MG tablet Take 650 mg by mouth daily as needed for pain.      . Aspirin-Salicylamide-Caffeine (BC HEADACHE POWDER PO) Take 1 packet by mouth once.      Marland Kitchen HYDROcodone-acetaminophen (NORCO/VICODIN) 5-325 MG per tablet Take 1-2 tablets by mouth every 4 (four) hours as needed for pain.  20 tablet  0  . ibuprofen (ADVIL,MOTRIN) 200 MG tablet Take 800 mg by mouth daily as needed for pain.      Marland Kitchen penicillin v potassium (VEETID) 500 MG tablet Take 1 tablet (500 mg total) by mouth 3 (three) times daily.  30 tablet  0  . [DISCONTINUED] diphenhydrAMINE (BENADRYL) 25 mg capsule Take 50 mg by mouth 2 (two) times  daily as needed. For itching         No Known Allergies  ROS Pertinent items in HPI  PHYSICAL EXAM Filed Vitals:   04/17/13 2013  BP: 128/73  Pulse: 91  Temp: 98.3 F (36.8 C)  Resp: 18   General: Well nourished, well developed female in no acute distress Cardiovascular: Normal rate Respiratory: Normal effort Abdomen: Soft, obese, nontender Back: No CVAT. Mild tenderness over lumbosacral paraspinous muscles Extremities: No edema Neurologic: Alert and oriented Speculum exam: NEFG; vagina with  Moderate amount bubbly gray discharge, no blood; cervix clean Bimanual exam: cervix closed, no CMT; uterus NSSP; no adnexal tenderness or masses   LAB RESULTS Results for orders placed during the hospital encounter of 04/17/13 (from the past 24 hour(s))  URINALYSIS, ROUTINE W REFLEX MICROSCOPIC     Status: Abnormal   Collection Time    04/17/13  8:22 PM      Result Value Range   Color, Urine YELLOW  YELLOW   APPearance CLEAR  CLEAR   Specific Gravity, Urine 1.020  1.005 - 1.030   pH 6.0  5.0 - 8.0   Glucose, UA NEGATIVE  NEGATIVE mg/dL   Hgb urine dipstick NEGATIVE  NEGATIVE   Bilirubin Urine NEGATIVE  NEGATIVE   Ketones, ur NEGATIVE  NEGATIVE mg/dL    Protein, ur NEGATIVE  NEGATIVE mg/dL   Urobilinogen, UA 0.2  0.0 - 1.0 mg/dL   Nitrite NEGATIVE  NEGATIVE   Leukocytes, UA MODERATE (*) NEGATIVE  URINE MICROSCOPIC-ADD ON     Status: Abnormal   Collection Time    04/17/13  8:22 PM      Result Value Range   Squamous Epithelial / LPF FEW (*) RARE   WBC, UA 7-10  <3 WBC/hpf   Bacteria, UA FEW (*) RARE   Urine-Other MUCOUS PRESENT    WET PREP, GENITAL     Status: Abnormal   Collection Time    04/17/13  8:35 PM      Result Value Range   Yeast Wet Prep HPF POC NONE SEEN  NONE SEEN   Trich, Wet Prep MODERATE (*) NONE SEEN   Clue Cells Wet Prep HPF POC NONE SEEN  NONE SEEN   WBC, Wet Prep HPF POC TOO NUMEROUS TO COUNT (*) NONE SEEN  POCT PREGNANCY, URINE     Status: None   Collection Time    04/17/13  9:00 PM      Result Value Range   Preg Test, Ur NEGATIVE  NEGATIVE      ASSESSMENT  1. Trichomonal vulvovaginitis     PLAN Discharge home. See AVS for patient education.    Medication List    STOP taking these medications       HYDROcodone-acetaminophen 5-325 MG per tablet  Commonly known as:  NORCO/VICODIN     penicillin v potassium 500 MG tablet  Commonly known as:  VEETID      TAKE these medications       BC HEADACHE POWDER PO  Take 1 packet by mouth once.     ibuprofen 200 MG tablet  Commonly known as:  ADVIL,MOTRIN  Take 800 mg by mouth daily as needed for pain.     metroNIDAZOLE 500 MG tablet  Commonly known as:  FLAGYL  Take all 4 tabs at once       Follow-up Information   Schedule an appointment as soon as possible for a [facility] with [facility] (As needed)  Contact information:   [address] [place] [id number] [phone]       Danae OrleansDeirdre C Harsimran Westman, CNM 04/17/2013 8:33 PM

## 2013-04-18 LAB — URINE CULTURE
Colony Count: NO GROWTH
Culture: NO GROWTH

## 2013-04-18 LAB — GC/CHLAMYDIA PROBE AMP
CT PROBE, AMP APTIMA: POSITIVE — AB
GC PROBE AMP APTIMA: NEGATIVE

## 2013-04-18 NOTE — MAU Provider Note (Signed)
Attestation of Attending Supervision of Advanced Practitioner (CNM/NP): Evaluation and management procedures were performed by the Advanced Practitioner under my supervision and collaboration.  I have reviewed the Advanced Practitioner's note and chart, and I agree with the management and plan.  Victoria Rivers 04/18/2013 7:37 AM

## 2013-06-15 ENCOUNTER — Emergency Department (INDEPENDENT_AMBULATORY_CARE_PROVIDER_SITE_OTHER)
Admission: EM | Admit: 2013-06-15 | Discharge: 2013-06-15 | Disposition: A | Discharge status: Home / Self Care | Payer: Self-pay | Source: Home / Self Care | Attending: Family Medicine | Admitting: Family Medicine

## 2013-06-15 ENCOUNTER — Encounter (HOSPITAL_COMMUNITY): Payer: Self-pay | Admitting: Emergency Medicine

## 2013-06-15 DIAGNOSIS — S93409A Sprain of unspecified ligament of unspecified ankle, initial encounter: Secondary | ICD-10-CM

## 2013-06-15 DIAGNOSIS — S93401A Sprain of unspecified ligament of right ankle, initial encounter: Secondary | ICD-10-CM

## 2013-06-15 DIAGNOSIS — S93402A Sprain of unspecified ligament of left ankle, initial encounter: Secondary | ICD-10-CM

## 2013-06-15 MED ORDER — MELOXICAM 7.5 MG PO TABS: 7.5000 mg | ORAL_TABLET | Freq: Two times a day (BID) | ORAL | Status: DC

## 2013-06-15 NOTE — ED Notes (Signed)
Patient complains of bilateral feet swelling States fell about three weeks ago but the swelling has  Been going on for about four months

## 2013-06-15 NOTE — ED Provider Notes (Signed)
CSN: [id number]     Arrival date & time 06/15/13  1832 History   First MD Initiated Contact with Patient 06/15/13 2043     Chief Complaint  Patient presents with  . Leg Swelling   (Consider location/radiation/quality/duration/timing/severity/associated sxs/prior Treatment) Patient is a 38 y.o. female presenting with ankle pain. The history is provided by the patient.  Ankle Pain Location:  Ankle Time since incident:  4 months Injury: yes   Mechanism of injury: fall   Ankle location:  L ankle and R ankle Chronicity:  Chronic Dislocation: no   Foreign body present:  No foreign bodies Prior injury to area:  No Associated symptoms: stiffness and swelling   Associated symptoms: no decreased ROM   Risk factors: obesity     Past Medical History  Diagnosis Date  . Pregnancy, ectopic   . BV (bacterial vaginosis)   . Gonorrhea   . Chlamydia    Past Surgical History  Procedure Laterality Date  . Ectopic pregnancy surgery    . Dilation and curettage of uterus     Family History  Problem Relation Age of Onset  . Diabetes Maternal Grandmother   . Heart disease Maternal Grandmother    History  Substance Use Topics  . Smoking status: Current Some Day Smoker    Types: Cigarettes    Last Attempt to Quit: 08/05/2012  . Smokeless tobacco: Never Used  . Alcohol Use: Yes     Comment: occasionally   OB History   Grav Para Term Preterm Abortions TAB SAB Ect Mult Living   7 4 4  3 2  1  4      Review of Systems  Constitutional: Negative.   Musculoskeletal: Positive for gait problem, joint swelling and stiffness. Negative for arthralgias.  Skin: Negative.     Allergies  Review of patient's allergies indicates no known allergies.  Home Medications   Current Outpatient Rx  Name  Route  Sig  Dispense  Refill  . Aspirin-Salicylamide-Caffeine (BC HEADACHE POWDER PO)   Oral   Take 1 packet by mouth once.         Marland Kitchen ibuprofen (ADVIL,MOTRIN) 200 MG tablet   Oral   Take 800 mg  by mouth daily as needed for pain.         . meloxicam (MOBIC) 7.5 MG tablet   Oral   Take 1 tablet (7.5 mg total) by mouth 2 (two) times daily.   30 tablet   1   . metroNIDAZOLE (FLAGYL) 500 MG tablet      Take all 4 tabs at once   4 tablet   0    BP 144/84  Pulse 71  Temp(Src) 98.2 F (36.8 C) (Oral)  Resp 16 Physical Exam  Nursing note and vitals reviewed. Constitutional: She is oriented to person, place, and time. She appears well-developed and well-nourished.  Abdominal: Soft. Bowel sounds are normal.  Musculoskeletal: Normal range of motion. She exhibits no edema and no tenderness.       Right ankle: Normal. She exhibits no swelling.       Left ankle: Normal. She exhibits no swelling.       Right foot: Normal.       Left foot: Normal.  Neurological: She is alert and oriented to person, place, and time.  Skin: Skin is warm and dry.    ED Course  Procedures (including critical care time) Labs Review Labs Reviewed - No data to display Imaging Review No results found.   MDM  1. Left ankle sprain   2. Right ankle sprain        Linna HoffJames D Arrielle Mcginn, MD 06/15/13 2129

## 2013-06-15 NOTE — Discharge Instructions (Signed)
Wear ankle support as needed for comfort, activity as tolerated. advil or pain medicine as needed, warm soaks nightly.  see orthopedist if further problems.

## 2013-10-30 ENCOUNTER — Inpatient Hospital Stay (HOSPITAL_COMMUNITY)
Admission: AC | Admit: 2013-10-30 | Discharge: 2013-10-30 | Disposition: A | Discharge status: Home / Self Care | Payer: Self-pay | Source: Ambulatory Visit | Attending: Obstetrics and Gynecology | Admitting: Obstetrics and Gynecology

## 2013-10-30 ENCOUNTER — Encounter (HOSPITAL_COMMUNITY): Payer: Self-pay | Admitting: *Deleted

## 2013-10-30 DIAGNOSIS — F17200 Nicotine dependence, unspecified, uncomplicated: Secondary | ICD-10-CM | POA: Insufficient documentation

## 2013-10-30 DIAGNOSIS — A5901 Trichomonal vulvovaginitis: Secondary | ICD-10-CM | POA: Insufficient documentation

## 2013-10-30 DIAGNOSIS — S93409A Sprain of unspecified ligament of unspecified ankle, initial encounter: Secondary | ICD-10-CM

## 2013-10-30 DIAGNOSIS — N949 Unspecified condition associated with female genital organs and menstrual cycle: Secondary | ICD-10-CM | POA: Insufficient documentation

## 2013-10-30 DIAGNOSIS — R109 Unspecified abdominal pain: Secondary | ICD-10-CM | POA: Insufficient documentation

## 2013-10-30 LAB — URINE MICROSCOPIC-ADD ON

## 2013-10-30 LAB — WET PREP, GENITAL
Clue Cells Wet Prep HPF POC: NONE SEEN
YEAST WET PREP: NONE SEEN

## 2013-10-30 LAB — URINALYSIS, ROUTINE W REFLEX MICROSCOPIC
Bilirubin Urine: NEGATIVE
Glucose, UA: NEGATIVE mg/dL
Hgb urine dipstick: NEGATIVE
Ketones, ur: NEGATIVE mg/dL
NITRITE: NEGATIVE
PH: 6.5 (ref 5.0–8.0)
Protein, ur: NEGATIVE mg/dL
SPECIFIC GRAVITY, URINE: 1.02 (ref 1.005–1.030)
Urobilinogen, UA: 0.2 mg/dL (ref 0.0–1.0)

## 2013-10-30 LAB — POCT PREGNANCY, URINE: Preg Test, Ur: NEGATIVE

## 2013-10-30 MED ORDER — METRONIDAZOLE 500 MG PO TABS: ORAL_TABLET | ORAL | Status: DC

## 2013-10-30 NOTE — MAU Provider Note (Signed)
CC: Abdominal Pain    First Provider Initiated Contact with Patient 10/30/13 1820      HPI LUDIE HUDON is a 38 y.o. [id number] who presents with onset One week ago C. Big abdominal discomfort along with vaginal irritation and itching. Feels similar to when she had trich in the past. Used Monistat and Femstat cream 2 nights ago without improvement. Denies abnormal bleeding. No contraception. No new sex partner. Denies dysuria, hematuria, urgency or burning of urination. LMP mid July.  No PCP. Needs GYN referral for contraception and Pap.  Past Medical History  Diagnosis Date  . Pregnancy, ectopic   . BV (bacterial vaginosis)   . Gonorrhea   . Chlamydia     OB History  Gravida Para Term Preterm AB SAB TAB Ectopic Multiple Living  # Outcome Date GA Lbr Len/2nd Weight Sex Delivery Anes PTL Lv  7 TRM           6 TAB           5 TAB           4 ECT           3 TRM           2 TRM           1 TRM               Past Surgical History  Procedure Laterality Date  . Ectopic pregnancy surgery    . Dilation and curettage of uterus      History   Social History  . Marital Status: Divorced    Spouse Name: N/A    Number of Children: N/A  . Years of Education: N/A   Occupational History  . Not on file.   Social History Main Topics  . Smoking status: Current Some Day Smoker    Types: Cigarettes    Last Attempt to Quit: 08/05/2012  . Smokeless tobacco: Never Used  . Alcohol Use: Yes     Comment: occasionally  . Drug Use: No  . Sexual Activity: Yes    Birth Control/ Protection: None     Comment: three days ago   Other Topics Concern  . Not on file   Social History Narrative  . No narrative on file    No current facility-administered medications on file prior to encounter.   Current Outpatient Prescriptions on File Prior to Encounter  Medication Sig Dispense Refill  . Aspirin-Salicylamide-Caffeine (BC HEADACHE POWDER PO) Take 1 packet by mouth  once.      Marland Kitchen ibuprofen (ADVIL,MOTRIN) 200 MG tablet Take 800 mg by mouth daily as needed for pain.      . meloxicam (MOBIC) 7.5 MG tablet Take 1 tablet (7.5 mg total) by mouth 2 (two) times daily.  30 tablet  1  . metroNIDAZOLE (FLAGYL) 500 MG tablet Take all 4 tabs at once  4 tablet  0  . [DISCONTINUED] diphenhydrAMINE (BENADRYL) 25 mg capsule Take 50 mg by mouth 2 (two) times daily as needed. For itching         No Known Allergies  ROS Pertinent items in HPI  PHYSICAL EXAM There were no vitals filed for this visit. General: Obese female in no acute distress Cardiovascular: Normal rate Respiratory: Normal effort Abdomen: Soft, nontender throughout Back: No CVAT Extremities: No edema Neurologic: Alert and oriented Speculum exam: NEFG; vagina with thick white discharge; cervix clean  Bimanual exam: cervix closed, no CMT; uterus unable to outline due to body habitus; no adnexal tenderness or masses   LAB RESULTS Results for orders placed during the hospital encounter of 10/30/13 (from the past 24 hour(s))  URINALYSIS, ROUTINE W REFLEX MICROSCOPIC     Status: Abnormal   Collection Time    10/30/13  6:15 PM      Result Value Ref Range   Color, Urine YELLOW  YELLOW   APPearance CLEAR  CLEAR   Specific Gravity, Urine 1.020  1.005 - 1.030   pH 6.5  5.0 - 8.0   Glucose, UA NEGATIVE  NEGATIVE mg/dL   Hgb urine dipstick NEGATIVE  NEGATIVE   Bilirubin Urine NEGATIVE  NEGATIVE   Ketones, ur NEGATIVE  NEGATIVE mg/dL   Protein, ur NEGATIVE  NEGATIVE mg/dL   Urobilinogen, UA 0.2  0.0 - 1.0 mg/dL   Nitrite NEGATIVE  NEGATIVE   Leukocytes, UA MODERATE (*) NEGATIVE  URINE MICROSCOPIC-ADD ON     Status: Abnormal   Collection Time    10/30/13  6:15 PM      Result Value Ref Range   Squamous Epithelial / LPF MANY (*) RARE   WBC, UA 3-6  <3 WBC/hpf   RBC / HPF 0-2  <3 RBC/hpf   Bacteria, UA MANY (*) RARE   Urine-Other TRICHOMONAS PRESENT    POCT PREGNANCY, URINE     Status: None    Collection Time    10/30/13  6:21 PM      Result Value Ref Range   Preg Test, Ur NEGATIVE  NEGATIVE  WET PREP, GENITAL     Status: Abnormal   Collection Time    10/30/13  6:31 PM      Result Value Ref Range   Yeast Wet Prep HPF POC NONE SEEN  NONE SEEN   Trich, Wet Prep FEW (*) NONE SEEN   Clue Cells Wet Prep HPF POC NONE SEEN  NONE SEEN   WBC, Wet Prep HPF POC MODERATE (*) NONE SEEN    IMAGING No results found.  MAU COURSE GC/CT sent  ASSESSMENT  1. Trichomonal vaginitis     PLAN Discharge home. See AVS for patient education.    Medication List    STOP taking these medications       miconazole 2 % vaginal cream  Commonly known as:  MONISTAT 7      TAKE these medications       metroNIDAZOLE 500 MG tablet  Commonly known as:  FLAGYL  Take all 4 tabs at once      1 refill for partner to take Follow-up Information   Schedule an appointment as soon as possible for a visit with [facility] [place]. (If symptoms worsen)    Contact information:   [address] [place] [address] [phone]        Danae Orleans, CNM 10/30/2013 6:21 PM

## 2013-10-30 NOTE — MAU Note (Signed)
Patient presents with complaint of lower abdominal pain X 2-3 days and vaginal irritation X 1 week.

## 2013-10-30 NOTE — Discharge Instructions (Signed)
Prenatal [facility] OB/GYN    [facility] OB/GYN  & Infertility  [phone]     Phone: [phone]          [facility]                      [facility]   [place]     Phone: [phone]  Phone: [phone]         [facility]     Phone: [phone]  Phone: [phone]           [facility] [place]                hone: [phone]  Phone: [phone]         [facility] [address] Ceo      Phone: [phone]  Phone: [phone]         [facility]                Phone: [phone]  [facility]   [address] [place])          Phone: [phone] [facility]   Phone: [phone] Trichomoniasis Trichomoniasis is an infection caused by an organism called Trichomonas. The infection can affect both women and men. In women, the outer female genitalia and the vagina are affected. In men, the penis is mainly affected, but the prostate and other reproductive organs can also be involved. Trichomoniasis is a sexually transmitted infection (STI) and is most often passed to another person through sexual contact.  RISK FACTORS  Having unprotected sexual intercourse.  Having sexual intercourse with an infected partner. SIGNS AND SYMPTOMS  Symptoms of trichomoniasis in women include:  Abnormal gray-green frothy vaginal discharge.  Itching and irritation of the vagina.  Itching and irritation of the area outside the vagina. Symptoms of trichomoniasis in men include:   Penile discharge with or without pain.  Pain during urination. This results from inflammation of the urethra. DIAGNOSIS  Trichomoniasis may be found during a Pap test or physical exam. Your health care provider may use one of the following methods to help diagnose this infection:  Examining  vaginal discharge under a microscope. For men, urethral discharge would be examined.  Testing the pH of the vagina with a test tape.  Using a vaginal swab test that checks for the Trichomonas organism. A test is available that provides results within a few minutes.  Doing a culture test for the organism. This is not usually needed. TREATMENT   You may be given medicine to fight the infection. Women should inform their health care provider if they could be or are pregnant. Some medicines used to treat the infection should not be taken during pregnancy.  Your health care provider may recommend over-the-counter medicines or creams to decrease itching or irritation.  Your sexual partner will need to be treated if infected. HOME CARE INSTRUCTIONS   Take medicines only as directed by your health care provider.  Take over-the-counter medicine for itching or irritation as directed by your health care provider.  Do not have sexual intercourse while you have the infection.  Women should not douche or wear tampons while they have the infection.  Discuss your infection with your partner. Your partner may have gotten the infection from you, or you may have gotten it from  your partner.  Have your sex partner get examined and treated if necessary.  Practice safe, informed, and protected sex.  See your health care provider for other STI testing. SEEK MEDICAL CARE IF:   You still have symptoms after you finish your medicine.  You develop abdominal pain.  You have pain when you urinate.  You have bleeding after sexual intercourse.  You develop a rash.  Your medicine makes you sick or makes you throw up (vomit). MAKE SURE YOU:  Understand these instructions.  Will watch your condition.  Will get help right away if you are not doing well or get worse. Document Released: 08/19/2000 Document Revised: 07/10/2013 Document Reviewed: 12/05/2012 [facility] Patient Information 2015 [place],  [place]. This information is not intended to replace advice given to you by your health care provider. Make sure you discuss any questions you have with your health care provider.

## 2013-10-31 LAB — GC/CHLAMYDIA PROBE AMP
CT Probe RNA: NEGATIVE
GC Probe RNA: NEGATIVE

## 2013-10-31 NOTE — MAU Provider Note (Signed)
Attestation of Attending Supervision of Advanced Practitioner (CNM/NP): Evaluation and management procedures were performed by the Advanced Practitioner under my supervision and collaboration.  I have reviewed the Advanced Practitioner's note and chart, and I agree with the management and plan.  Bow Buntyn 10/31/2013 1:34 AM   

## 2013-11-01 ENCOUNTER — Telehealth: Payer: Self-pay | Admitting: *Deleted

## 2013-11-01 NOTE — Telephone Encounter (Signed)
Message copied by Gerome Apley on Wed Nov 01, 2013  3:23 PM ------      Message from: POE, DEIRDRE C      Created: Wed Nov 01, 2013  2:31 PM       + Chlamydia. Rx Zithro 1 gm po 1 refill for partner. No sex wil TOC ------

## 2014-01-08 ENCOUNTER — Encounter (HOSPITAL_COMMUNITY): Payer: Self-pay | Admitting: *Deleted

## 2014-05-04 ENCOUNTER — Emergency Department (HOSPITAL_COMMUNITY): Payer: PRIVATE HEALTH INSURANCE

## 2014-05-04 ENCOUNTER — Encounter (HOSPITAL_COMMUNITY): Payer: Self-pay | Admitting: Emergency Medicine

## 2014-05-04 ENCOUNTER — Observation Stay (HOSPITAL_COMMUNITY)
Admission: EM | Admit: 2014-05-04 | Discharge: 2014-05-06 | Disposition: A | Discharge status: Home / Self Care | Payer: Self-pay | Attending: Internal Medicine | Admitting: Internal Medicine

## 2014-05-04 ENCOUNTER — Emergency Department (INDEPENDENT_AMBULATORY_CARE_PROVIDER_SITE_OTHER)
Admission: EM | Admit: 2014-05-04 | Discharge: 2014-05-04 | Disposition: A | Discharge status: Nursing Home (Includes ICF) | Payer: Self-pay | Source: Home / Self Care | Attending: Emergency Medicine | Admitting: Emergency Medicine

## 2014-05-04 ENCOUNTER — Encounter (HOSPITAL_COMMUNITY): Payer: Self-pay | Admitting: *Deleted

## 2014-05-04 DIAGNOSIS — E872 Acidosis, unspecified: Secondary | ICD-10-CM | POA: Diagnosis present

## 2014-05-04 DIAGNOSIS — R6889 Other general symptoms and signs: Secondary | ICD-10-CM

## 2014-05-04 DIAGNOSIS — K529 Noninfective gastroenteritis and colitis, unspecified: Secondary | ICD-10-CM

## 2014-05-04 DIAGNOSIS — J111 Influenza due to unidentified influenza virus with other respiratory manifestations: Secondary | ICD-10-CM

## 2014-05-04 DIAGNOSIS — E876 Hypokalemia: Secondary | ICD-10-CM | POA: Insufficient documentation

## 2014-05-04 DIAGNOSIS — E669 Obesity, unspecified: Secondary | ICD-10-CM | POA: Insufficient documentation

## 2014-05-04 DIAGNOSIS — E860 Dehydration: Secondary | ICD-10-CM

## 2014-05-04 DIAGNOSIS — J09X2 Influenza due to identified novel influenza A virus with other respiratory manifestations: Principal | ICD-10-CM | POA: Insufficient documentation

## 2014-05-04 DIAGNOSIS — Z6841 Body Mass Index (BMI) 40.0 and over, adult: Secondary | ICD-10-CM | POA: Insufficient documentation

## 2014-05-04 DIAGNOSIS — R112 Nausea with vomiting, unspecified: Secondary | ICD-10-CM | POA: Diagnosis present

## 2014-05-04 DIAGNOSIS — R000 Tachycardia, unspecified: Secondary | ICD-10-CM | POA: Insufficient documentation

## 2014-05-04 DIAGNOSIS — F17210 Nicotine dependence, cigarettes, uncomplicated: Secondary | ICD-10-CM | POA: Insufficient documentation

## 2014-05-04 DIAGNOSIS — J208 Acute bronchitis due to other specified organisms: Secondary | ICD-10-CM | POA: Insufficient documentation

## 2014-05-04 LAB — URINALYSIS, ROUTINE W REFLEX MICROSCOPIC
Glucose, UA: NEGATIVE mg/dL
Hgb urine dipstick: NEGATIVE
Ketones, ur: 15 mg/dL — AB
Nitrite: NEGATIVE
PROTEIN: 30 mg/dL — AB
Specific Gravity, Urine: 1.021 (ref 1.005–1.030)
Urobilinogen, UA: 0.2 mg/dL (ref 0.0–1.0)
pH: 5.5 (ref 5.0–8.0)

## 2014-05-04 LAB — CBC WITH DIFFERENTIAL/PLATELET
Basophils Absolute: 0 10*3/uL (ref 0.0–0.1)
Basophils Relative: 0 % (ref 0–1)
EOS PCT: 0 % (ref 0–5)
Eosinophils Absolute: 0 10*3/uL (ref 0.0–0.7)
HEMATOCRIT: 33.1 % — AB (ref 36.0–46.0)
HEMOGLOBIN: 10.6 g/dL — AB (ref 12.0–15.0)
LYMPHS ABS: 0.3 10*3/uL — AB (ref 0.7–4.0)
LYMPHS PCT: 4 % — AB (ref 12–46)
MCH: 23.3 pg — ABNORMAL LOW (ref 26.0–34.0)
MCHC: 32 g/dL (ref 30.0–36.0)
MCV: 72.9 fL — AB (ref 78.0–100.0)
MONO ABS: 0.2 10*3/uL (ref 0.1–1.0)
MONOS PCT: 2 % — AB (ref 3–12)
NEUTROS ABS: 8.1 10*3/uL — AB (ref 1.7–7.7)
Neutrophils Relative %: 94 % — ABNORMAL HIGH (ref 43–77)
Platelets: 310 10*3/uL (ref 150–400)
RBC: 4.54 MIL/uL (ref 3.87–5.11)
RDW: 15.6 % — ABNORMAL HIGH (ref 11.5–15.5)
WBC: 8.7 10*3/uL (ref 4.0–10.5)

## 2014-05-04 LAB — I-STAT CHEM 8, ED
BUN: 11 mg/dL (ref 6–23)
CREATININE: 1.6 mg/dL — AB (ref 0.50–1.10)
Calcium, Ion: 1.12 mmol/L (ref 1.12–1.23)
Chloride: 102 mmol/L (ref 96–112)
Glucose, Bld: 113 mg/dL — ABNORMAL HIGH (ref 70–99)
HEMATOCRIT: 37 % (ref 36.0–46.0)
Hemoglobin: 12.6 g/dL (ref 12.0–15.0)
POTASSIUM: 3.3 mmol/L — AB (ref 3.5–5.1)
Sodium: 138 mmol/L (ref 135–145)
TCO2: 19 mmol/L (ref 0–100)

## 2014-05-04 LAB — URINE MICROSCOPIC-ADD ON

## 2014-05-04 LAB — I-STAT CG4 LACTIC ACID, ED
LACTIC ACID, VENOUS: 2.22 mmol/L — AB (ref 0.5–2.0)
Lactic Acid, Venous: 2.33 mmol/L (ref 0.5–2.0)

## 2014-05-04 LAB — PREGNANCY, URINE: PREG TEST UR: NEGATIVE

## 2014-05-04 MED ORDER — SODIUM CHLORIDE 0.9 % IV BOLUS (SEPSIS)
1000.0000 mL | Freq: Once | INTRAVENOUS | Status: AC
  Administered 2014-05-04: 1000 mL via INTRAVENOUS

## 2014-05-04 MED ORDER — SODIUM CHLORIDE 0.9 % IV SOLN: INTRAVENOUS | Status: DC

## 2014-05-04 MED ORDER — ONDANSETRON HCL 4 MG/2ML IJ SOLN: 4.0000 mg | Freq: Once | INTRAMUSCULAR | Status: DC

## 2014-05-04 MED ORDER — FENTANYL CITRATE 0.05 MG/ML IJ SOLN
50.0000 ug | Freq: Once | INTRAMUSCULAR | Status: AC
  Administered 2014-05-04: 50 ug via INTRAVENOUS
  Filled 2014-05-04: qty 2

## 2014-05-04 MED ORDER — ONDANSETRON 4 MG PO TBDP
8.0000 mg | ORAL_TABLET | Freq: Once | ORAL | Status: AC
  Administered 2014-05-04: 8 mg via ORAL

## 2014-05-04 MED ORDER — ONDANSETRON 4 MG PO TBDP
ORAL_TABLET | ORAL | Status: AC
  Filled 2014-05-04: qty 2

## 2014-05-04 MED ORDER — PROMETHAZINE HCL 25 MG/ML IJ SOLN
12.5000 mg | Freq: Once | INTRAMUSCULAR | Status: AC
  Administered 2014-05-04: 12.5 mg via INTRAVENOUS
  Filled 2014-05-04: qty 1

## 2014-05-04 MED ORDER — ACETAMINOPHEN 325 MG PO TABS
975.0000 mg | ORAL_TABLET | Freq: Once | ORAL | Status: AC
  Administered 2014-05-04: 975 mg via ORAL

## 2014-05-04 MED ORDER — ACETAMINOPHEN 325 MG PO TABS
ORAL_TABLET | ORAL | Status: AC
  Filled 2014-05-04: qty 3

## 2014-05-04 NOTE — ED Notes (Signed)
Pt has felt like she has had the flu for two days.  This afternoon she started feeling very dizzy and when she arrived here she was vomiting.  Pt complains of generalized body aches and some history was hard to obtain because she said she was having a hard time thinking.

## 2014-05-04 NOTE — Discharge Instructions (Signed)
We have determined that your problem requires further evaluation in the emergency department.  We will take care of your transport there.  Once at the emergency department, you will be evaluated by a provider and they will order whatever treatment or tests they deem necessary.  We cannot guarantee that they will do any specific test or do any specific treatment.  ° °

## 2014-05-04 NOTE — ED Provider Notes (Signed)
CSN: [id number]     Arrival date & time 05/04/14  1849 History   First MD Initiated Contact with Patient 05/04/14 1854     Chief Complaint  Patient presents with  . Generalized Body Aches  . Fever     (Consider location/radiation/quality/duration/timing/severity/associated sxs/prior Treatment) Patient is a 39 y.o. female presenting with fever. The history is provided by the patient.  Fever Associated symptoms: congestion, cough, diarrhea, nausea and vomiting   Associated symptoms: no chest pain, no headaches and no rash    patient was sent from urgent care with fever. Has had sore throat and a little bit of a cough. Some nasal congestion. Has had a little bit of nausea vomiting diarrhea. Fevers up to 103. Urgent care was unable to get an IV on her. She states her boyfriend had the similar symptoms.  Past Medical History  Diagnosis Date  . Pregnancy, ectopic   . BV (bacterial vaginosis)   . Gonorrhea   . Chlamydia    Past Surgical History  Procedure Laterality Date  . Ectopic pregnancy surgery    . Dilation and curettage of uterus     Family History  Problem Relation Age of Onset  . Diabetes Maternal Grandmother   . Heart disease Maternal Grandmother    History  Substance Use Topics  . Smoking status: Current Some Day Smoker    Types: Cigarettes    Last Attempt to Quit: 08/05/2012  . Smokeless tobacco: Never Used  . Alcohol Use: Yes     Comment: occasionally   OB History    Gravida Para Term Preterm AB TAB SAB Ectopic Multiple Living   Review of Systems  Constitutional: Positive for fever and appetite change. Negative for activity change.  HENT: Positive for congestion. Negative for mouth sores.   Eyes: Negative for pain.  Respiratory: Positive for cough. Negative for chest tightness and shortness of breath.   Cardiovascular: Negative for chest pain and leg swelling.  Gastrointestinal: Positive for nausea, vomiting and diarrhea. Negative for  abdominal pain.  Genitourinary: Negative for flank pain.  Musculoskeletal: Negative for back pain and neck stiffness.  Skin: Negative for rash.  Neurological: Negative for weakness, numbness and headaches.  Psychiatric/Behavioral: Negative for behavioral problems.      Allergies  Review of patient's allergies indicates no known allergies.  Home Medications   Prior to Admission medications   Medication Sig Start Date End Date Taking? Authorizing Provider  acetaminophen (TYLENOL) 500 MG tablet Take 1,000 mg by mouth every 6 (six) hours as needed for moderate pain or fever.   Yes Historical Provider, MD  Phenylephrine-Pheniramine-DM [facility] COLD & COUGH PO) Take 1 packet by mouth daily as needed (cold symptoms).   Yes Historical Provider, MD  metroNIDAZOLE (FLAGYL) 500 MG tablet Take all 4 tabs at once Patient not taking: Reported on 05/04/2014 10/30/13   Deirdre C Poe, CNM   BP 107/63 mmHg  Pulse 109  Temp(Src) 100.4 F (38 C) (Oral)  Resp 20  Ht 5' 6.5" (1.689 m)  Wt 270 lb (122.471 kg)  BMI 42.93 kg/m2  SpO2 100%  LMP 04/03/2014 Physical Exam  Constitutional: She is oriented to person, place, and time. She appears well-developed.  HENT:  Head: Normocephalic.  Mouth/Throat: No oropharyngeal exudate.  Neck: Normal range of motion. Neck supple.  Cardiovascular: Regular rhythm and normal heart sounds.   No murmur heard. Tachycardia  Pulmonary/Chest: Effort normal and breath  sounds normal. No respiratory distress. She has no wheezes. She has no rales.  Abdominal: Soft. She exhibits no distension. There is no tenderness.  Musculoskeletal: Normal range of motion.  Neurological: She is alert and oriented to person, place, and time. No cranial nerve deficit.  Skin: Skin is warm and dry.  Psychiatric: She has a normal mood and affect. Her speech is normal.  Nursing note and vitals reviewed.   ED Course  Procedures (including critical care time) Labs Review Labs Reviewed   CBC WITH DIFFERENTIAL/PLATELET - Abnormal; Notable for the following:    Hemoglobin 10.6 (*)    HCT 33.1 (*)    MCV 72.9 (*)    MCH 23.3 (*)    RDW 15.6 (*)    Neutrophils Relative % 94 (*)    Neutro Abs 8.1 (*)    Lymphocytes Relative 4 (*)    Lymphs Abs 0.3 (*)    Monocytes Relative 2 (*)    All other components within normal limits  URINALYSIS, ROUTINE W REFLEX MICROSCOPIC - Abnormal; Notable for the following:    APPearance HAZY (*)    Bilirubin Urine SMALL (*)    Ketones, ur 15 (*)    Protein, ur 30 (*)    Leukocytes, UA SMALL (*)    All other components within normal limits  URINE MICROSCOPIC-ADD ON - Abnormal; Notable for the following:    Squamous Epithelial / LPF MANY (*)    Bacteria, UA FEW (*)    Casts GRANULAR CAST (*)    All other components within normal limits  I-STAT CG4 LACTIC ACID, ED - Abnormal; Notable for the following:    Lactic Acid, Venous 2.22 (*)    All other components within normal limits  I-STAT CHEM 8, ED - Abnormal; Notable for the following:    Potassium 3.3 (*)    Creatinine, Ser 1.60 (*)    Glucose, Bld 113 (*)    All other components within normal limits  I-STAT CG4 LACTIC ACID, ED - Abnormal; Notable for the following:    Lactic Acid, Venous 2.33 (*)    All other components within normal limits  PREGNANCY, URINE  INFLUENZA PANEL BY PCR (TYPE A & B, H1N1)    Imaging Review Dg Chest 2 View  05/04/2014   CLINICAL DATA:  Generalized body aches and fever.  EXAM: CHEST  2 VIEW  COMPARISON:  11/27/2008  FINDINGS: There is perihilar bronchial and interstitial thickening. No consolidation. The cardiomediastinal contours are normal. Pulmonary vasculature is normal. No pleural effusion or pneumothorax. No acute osseous abnormalities are seen.  IMPRESSION: Perihilar bronchial and interstitial thickening most consistent with bronchitis or atypical viral infection.   Electronically Signed   By: Rubye OaksMelanie  Ehinger M.D.   On: 05/04/2014 22:08     EKG  Interpretation None      MDM   Final diagnoses:  Flu-like symptoms  Acute viral bronchitis  Sinus tachycardia    Patient with flulike symptoms. Has had 3 L of fluid remains tachycardic. Has had a fever also. Urine does not show infection. Chest x-ray shows bronchitis. Creatinine is mildly increased. Will admit to internal medicine for observation.    Juliet RudeNathan R. Rubin PayorPickering, MD 05/05/14 [phone]

## 2014-05-04 NOTE — ED Notes (Signed)
IV access was unable to be obtained after three attempts.  Pt was given sublingual Zofran and 975mg  of oral Tylenol.  Pt is resting comfortably on the exam table.

## 2014-05-04 NOTE — ED Notes (Signed)
Dr Rubin PayorPickering given a copy of lactic acid results 2.33

## 2014-05-04 NOTE — ED Notes (Signed)
[facility] was called for transport to the ED.  It[place], ED Charge RN was called and given report.

## 2014-05-04 NOTE — ED Provider Notes (Signed)
   Chief Complaint   Fever; Emesis; Pain; and Dizziness    History of Present Illness   Victoria Rivers is a 39 year old female who's had a two-day history of nausea, vomiting, and diarrhea. There's been no blood in the vomitus or the stool. She's had subjective fever, chills, sore throat, and cough. She denies any abdominal pain or cramping. No muscle cramping. She's been exposed her boyfriend who had the same thing. No suspicious ingestions or foreign travel.  Review of Systems   Other than as noted above, the patient denies any of the following symptoms: Systemic:  No fevers, chills, or dizziness. GI:  Blood in stool or vomitus. GU:  No dysuria, frequency, or urgency.  PMFSH   Past medical history, family history, social history, meds, and allergies were reviewed.    Physical Exam     Vital signs:  BP 92/54 mmHg  Pulse 146  Temp(Src) 102.5 F (39.2 C) (Oral)  Resp 22  SpO2 100% No data found.  General:  She appears lethargic and uncomfortable.  Skin warm and dry.  Good skin turgor, brisk capillary refill. ENT:  No scleral icterus, moist mucous membranes, no oral lesions, pharynx clear. Lungs:  Breath sounds clear and equal bilaterally.  No wheezes, rales, or rhonchi. Heart:  Rhythm regular, without extrasystoles.  No gallops or murmers. Abdomen:  Soft, flat, nondistended. No organomegaly or mass. Bowel sounds were normally active. Skin: Clear, warm, and dry.  Good turgor.  Brisk capillary refill.  Course in Urgent Care Center   The following medications were given:  Medications  0.9 %  sodium chloride infusion   ondansetron (ZOFRAN-ODT) disintegrating tablet 8 mg     She was given Tylenol for the fever.  Assessment   The primary encounter diagnosis was Gastroenteritis. A diagnosis of Dehydration was also pertinent to this visit.  Plan   The patient was transferred to the ED via [facility] in stable condition.  Medical Decision Making:  The patient is a  39 year old female who's had a two-day history of nausea, vomiting, and diarrhea. She's had subjective fever, chills, sore throat and cough. She's been exposed her boyfriend who has the same thing. On examination her blood pressure is 92 systolic with rapid pulse and the fever of 102. She appears very lethargic. I impression was gastroenteritis with dehydration. We are starting IV normal saline, have given her Zofran sublingually and Tylenol, and we'll transfer her by [facility].       Reuben Likesavid C Gaspare Netzel, MD 05/04/14 [phone]

## 2014-05-04 NOTE — ED Notes (Signed)
Pt in from UC via [facility],pt c/o fever,generalized pain, chills, SOB onset today, A&O x4, pt rcvd 975 mL NS, & 8 mg ODT ZOfran pta

## 2014-05-05 DIAGNOSIS — E872 Acidosis, unspecified: Secondary | ICD-10-CM | POA: Diagnosis present

## 2014-05-05 DIAGNOSIS — R000 Tachycardia, unspecified: Secondary | ICD-10-CM | POA: Diagnosis present

## 2014-05-05 DIAGNOSIS — J111 Influenza due to unidentified influenza virus with other respiratory manifestations: Secondary | ICD-10-CM

## 2014-05-05 DIAGNOSIS — J1189 Influenza due to unidentified influenza virus with other manifestations: Secondary | ICD-10-CM

## 2014-05-05 DIAGNOSIS — J208 Acute bronchitis due to other specified organisms: Secondary | ICD-10-CM | POA: Diagnosis present

## 2014-05-05 DIAGNOSIS — R6889 Other general symptoms and signs: Secondary | ICD-10-CM | POA: Diagnosis present

## 2014-05-05 DIAGNOSIS — R112 Nausea with vomiting, unspecified: Secondary | ICD-10-CM | POA: Diagnosis present

## 2014-05-05 DIAGNOSIS — E876 Hypokalemia: Secondary | ICD-10-CM | POA: Diagnosis present

## 2014-05-05 LAB — BASIC METABOLIC PANEL
ANION GAP: 8 (ref 5–15)
ANION GAP: 4 — AB (ref 5–15)
BUN: 6 mg/dL (ref 6–23)
BUN: 5 mg/dL — AB (ref 6–23)
CALCIUM: 8.1 mg/dL — AB (ref 8.4–10.5)
CO2: 25 mmol/L (ref 19–32)
CO2: 25 mmol/L (ref 19–32)
CREATININE: 0.92 mg/dL (ref 0.50–1.10)
Calcium: 7.9 mg/dL — ABNORMAL LOW (ref 8.4–10.5)
Chloride: 106 mmol/L (ref 96–112)
Chloride: 112 mmol/L (ref 96–112)
Creatinine, Ser: 1.02 mg/dL (ref 0.50–1.10)
GFR calc Af Amer: 81 mL/min — ABNORMAL LOW (ref >90)
GFR calc Af Amer: >90 mL/min (ref >90)
GFR calc non Af Amer: 70 mL/min — ABNORMAL LOW (ref >90)
GFR, EST NON AFRICAN AMERICAN: 79 mL/min — AB (ref >90)
GLUCOSE: 110 mg/dL — AB (ref 70–99)
Glucose, Bld: 94 mg/dL (ref 70–99)
POTASSIUM: 2.9 mmol/L — AB (ref 3.5–5.1)
Potassium: 4.1 mmol/L (ref 3.5–5.1)
SODIUM: 139 mmol/L (ref 135–145)
SODIUM: 141 mmol/L (ref 135–145)

## 2014-05-05 LAB — CBC
HCT: 30 % — ABNORMAL LOW (ref 36.0–46.0)
HEMOGLOBIN: 9.3 g/dL — AB (ref 12.0–15.0)
MCH: 22.9 pg — ABNORMAL LOW (ref 26.0–34.0)
MCHC: 31 g/dL (ref 30.0–36.0)
MCV: 73.7 fL — AB (ref 78.0–100.0)
PLATELETS: 301 10*3/uL (ref 150–400)
RBC: 4.07 MIL/uL (ref 3.87–5.11)
RDW: 16 % — ABNORMAL HIGH (ref 11.5–15.5)
WBC: 5.3 10*3/uL (ref 4.0–10.5)

## 2014-05-05 LAB — INFLUENZA PANEL BY PCR (TYPE A & B)
H1N1 flu by pcr: DETECTED — AB
INFLAPCR: POSITIVE — AB
Influenza B By PCR: NEGATIVE

## 2014-05-05 MED ORDER — ONDANSETRON HCL 4 MG PO TABS: 4.0000 mg | ORAL_TABLET | Freq: Four times a day (QID) | ORAL | Status: DC | PRN

## 2014-05-05 MED ORDER — ENOXAPARIN SODIUM 30 MG/0.3ML ~~LOC~~ SOLN
30.0000 mg | SUBCUTANEOUS | Status: DC
Start: 2014-05-05 — End: 2014-05-05
  Administered 2014-05-05: 30 mg via SUBCUTANEOUS

## 2014-05-05 MED ORDER — ENOXAPARIN SODIUM 60 MG/0.6ML ~~LOC~~ SOLN
60.0000 mg | Freq: Every day | SUBCUTANEOUS | Status: DC
  Administered 2014-05-05: 60 mg via SUBCUTANEOUS
  Filled 2014-05-05 (×2): qty 0.6

## 2014-05-05 MED ORDER — SODIUM CHLORIDE 0.9 % IV SOLN
INTRAVENOUS | Status: DC
Start: 2014-05-05 — End: 2014-05-06
  Administered 2014-05-05: 02:00:00 via INTRAVENOUS

## 2014-05-05 MED ORDER — ALUM & MAG HYDROXIDE-SIMETH 200-200-20 MG/5ML PO SUSP: 30.0000 mL | Freq: Four times a day (QID) | ORAL | Status: DC | PRN

## 2014-05-05 MED ORDER — ALBUTEROL SULFATE (2.5 MG/3ML) 0.083% IN NEBU
2.5000 mg | INHALATION_SOLUTION | Freq: Four times a day (QID) | RESPIRATORY_TRACT | Status: DC
  Administered 2014-05-05: 2.5 mg via RESPIRATORY_TRACT
  Filled 2014-05-05: qty 3

## 2014-05-05 MED ORDER — ALBUTEROL SULFATE (2.5 MG/3ML) 0.083% IN NEBU: 2.5000 mg | INHALATION_SOLUTION | Freq: Four times a day (QID) | RESPIRATORY_TRACT | Status: DC | PRN

## 2014-05-05 MED ORDER — ONDANSETRON HCL 4 MG/2ML IJ SOLN: 4.0000 mg | Freq: Four times a day (QID) | INTRAMUSCULAR | Status: DC | PRN

## 2014-05-05 MED ORDER — POTASSIUM CHLORIDE CRYS ER 20 MEQ PO TBCR
40.0000 meq | EXTENDED_RELEASE_TABLET | Freq: Once | ORAL | Status: AC
  Administered 2014-05-05: 40 meq via ORAL
  Filled 2014-05-05: qty 2

## 2014-05-05 MED ORDER — BENZONATATE 100 MG PO CAPS
100.0000 mg | ORAL_CAPSULE | Freq: Three times a day (TID) | ORAL | Status: DC
  Administered 2014-05-05 – 2014-05-06 (×3): 100 mg via ORAL
  Filled 2014-05-05 (×3): qty 1

## 2014-05-05 MED ORDER — ACETAMINOPHEN 325 MG PO TABS
650.0000 mg | ORAL_TABLET | Freq: Four times a day (QID) | ORAL | Status: DC | PRN
  Administered 2014-05-05 – 2014-05-06 (×4): 650 mg via ORAL
  Filled 2014-05-05 (×4): qty 2

## 2014-05-05 MED ORDER — OSELTAMIVIR PHOSPHATE 75 MG PO CAPS
75.0000 mg | ORAL_CAPSULE | Freq: Two times a day (BID) | ORAL | Status: DC
Start: 2014-05-05 — End: 2014-05-06
  Administered 2014-05-05 – 2014-05-06 (×3): 75 mg via ORAL
  Filled 2014-05-05 (×5): qty 1

## 2014-05-05 MED ORDER — ACETAMINOPHEN 650 MG RE SUPP: 650.0000 mg | Freq: Four times a day (QID) | RECTAL | Status: DC | PRN

## 2014-05-05 MED ORDER — HYDROMORPHONE HCL 1 MG/ML IJ SOLN: 0.5000 mg | INTRAMUSCULAR | Status: DC | PRN

## 2014-05-05 MED ORDER — OXYCODONE HCL 5 MG PO TABS
5.0000 mg | ORAL_TABLET | ORAL | Status: DC | PRN
  Administered 2014-05-05 – 2014-05-06 (×2): 5 mg via ORAL
  Filled 2014-05-05 (×2): qty 1

## 2014-05-05 NOTE — H&P (Signed)
[facility] Admission History and Physical       JOSELYN EDLING YNW:[id number] DOB: 1975-04-20 DOA: 05/04/2014  Referring physician: EDP PCP: Pcp Not In System  Specialists:   Chief Complaint: Fever Chills and Body Aches  HPI: CREE NAPOLI is a 39 y.o. female who was [facility] and [facility] and sent tot he ED after 2 days of fever chills, myalgias sore throat, cough, sore throat, and SOB.   She also has had nausea and vomiting but no diarrhea.  Her  Significant other was diagnosed with the flu recently as well.   She was tachycardic  in the ED and was referred for admission.      Review of Systems:  Constitutional: No Weight Loss, No Weight Gain, Night Sweats, +Fevers, +Chills, +Myalgias, Dizziness, Light Headedness, Fatigue, or Generalized Weakness HEENT: No Headaches, Difficulty Swallowing,Tooth/Dental Problems,Sore Throat,  No Sneezing, Rhinitis, Ear Ache, Nasal Congestion, or Post Nasal Drip,  Cardio-vascular:  No Chest pain, Orthopnea, PND, Edema in Lower Extremities, Anasarca, Dizziness, Palpitations  Resp: +Dyspnea, No DOE, +Non-Productive Cough, No Hemoptysis, No Wheezing.    GI: No Heartburn, Indigestion, Abdominal Pain, Nausea, Vomiting, Diarrhea, Constipation, Hematemesis, Hematochezia, Melena, Change in Bowel Habits,  Loss of Appetite  GU: No Dysuria, No Change in Color of Urine, No Urgency or Urinary Frequency, No Flank pain.  Musculoskeletal: No Joint Pain or Swelling, No Decreased Range of Motion, No Back Pain.  Neurologic: No Syncope, No Seizures, Muscle Weakness, Paresthesia, Vision Disturbance or Loss, No Diplopia, No Vertigo, No Difficulty Walking,  Skin: No Rash or Lesions. Psych: No Change in Mood or Affect, No Depression or Anxiety, No Memory loss, No Confusion, or Hallucinations   Past Medical History  Diagnosis Date  . Pregnancy, ectopic   . BV (bacterial vaginosis)   . Gonorrhea   . Chlamydia      Past Surgical History  Procedure Laterality  Date  . Ectopic pregnancy surgery    . Dilation and curettage of uterus        Prior to Admission medications   Medication Sig Start Date End Date Taking? Authorizing Provider  acetaminophen (TYLENOL) 500 MG tablet Take 1,000 mg by mouth every 6 (six) hours as needed for moderate pain or fever.   Yes Historical Provider, MD  Phenylephrine-Pheniramine-DM [facility] COLD & COUGH PO) Take 1 packet by mouth daily as needed (cold symptoms).   Yes Historical Provider, MD  metroNIDAZOLE (FLAGYL) 500 MG tablet Take all 4 tabs at once Patient not taking: Reported on 05/04/2014 10/30/13   Deirdre C Poe, CNM     No Known Allergies  Social History:  reports that she has been smoking Cigarettes.  She has never used smokeless tobacco. She reports that she drinks alcohol. She reports that she does not use illicit drugs.    Family History  Problem Relation Age of Onset  . Diabetes Maternal Grandmother   . Heart disease Maternal Grandmother        Physical Exam:  GEN:  Pleasant ill Appearing Obese  39 y.o. [place] female examined and in no acute distress; cooperative with exam Filed Vitals:   05/04/14 2115 05/04/14 2215 05/04/14 2230 05/04/14 2315  BP: 102/50 105/49 101/49 110/55  Pulse: 118 117 117 113  Temp:      TempSrc:      Resp: Height:      Weight:      SpO2: 94% 95% 96% 99%   Blood pressure 110/55,  pulse 113, temperature 100.4 F (38 C), temperature source Oral, resp. rate 19, height 5' 6.5" (1.689 m), weight 122.471 kg (270 lb), last menstrual period 04/03/2014, SpO2 99 %. PSYCH: She is alert and oriented x4; does not appear anxious does not appear depressed; affect is normal HEENT: Normocephalic and Atraumatic, Mucous membranes pink; PERRLA; EOM intact; Fundi:  Benign;  No scleral icterus, Nares: Patent, Oropharynx: Clear,  Fair Dentition,    Neck:  FROM, No Cervical Lymphadenopathy nor Thyromegaly or Carotid Bruit; No JVD; Breasts:: Not examined CHEST WALL:  No tenderness CHEST: Normal respiration, clear to auscultation bilaterally HEART: Regular rate and rhythm; no murmurs rubs or gallops BACK: No kyphosis or scoliosis; No CVA tenderness ABDOMEN: Positive Bowel Sounds, Obese, Soft Non-Tender, No Rebound or Guarding; No Masses, No Organomegaly Rectal Exam: Not done EXTREMITIES: No Cyanosis, Clubbing, or Edema; No Ulcerations. Genitalia: not examined PULSES: 2+ and symmetric SKIN: Normal hydration no rash or ulceration CNS:  Alert and Oriented x 4, No Focal Deficits Vascular: pulses palpable throughout    Labs on Admission:  Basic Metabolic Panel:  Recent Labs Lab 05/04/14 1928  NA 138  K 3.3*  CL 102  GLUCOSE 113*  BUN 11  CREATININE 1.60*   Liver Function Tests: No results for input(s): AST, ALT, ALKPHOS, BILITOT, PROT, ALBUMIN in the last 168 hours. No results for input(s): LIPASE, AMYLASE in the last 168 hours. No results for input(s): AMMONIA in the last 168 hours. CBC:  Recent Labs Lab 05/04/14 1920 05/04/14 1928  WBC 8.7  --   NEUTROABS 8.1*  --   HGB 10.6* 12.6  HCT 33.1* 37.0  MCV 72.9*  --   PLT 310  --    Cardiac Enzymes: No results for input(s): CKTOTAL, CKMB, CKMBINDEX, TROPONINI in the last 168 hours.  BNP (last 3 results) No results for input(s): BNP in the last 8760 hours.  ProBNP (last 3 results) No results for input(s): PROBNP in the last 8760 hours.  CBG: No results for input(s): GLUCAP in the last 168 hours.  Radiological Exams on Admission: Dg Chest 2 View  05/04/2014   CLINICAL DATA:  Generalized body aches and fever.  EXAM: CHEST  2 VIEW  COMPARISON:  11/27/2008  FINDINGS: There is perihilar bronchial and interstitial thickening. No consolidation. The cardiomediastinal contours are normal. Pulmonary vasculature is normal. No pleural effusion or pneumothorax. No acute osseous abnormalities are seen.  IMPRESSION: Perihilar bronchial and interstitial thickening most consistent with bronchitis  or atypical viral infection.   Electronically Signed   By: Rubye Oaks M.D.   On: 05/04/2014 22:08       Assessment/Plan:   39 y.o. female with  Principal Problem:   1.   Flu-like symptoms   Flu Cx sent   Tamiflu 75 mg PO BID x 5 days   Symptom Care   Active Problems:   2.   Acute viral bronchitis   Albuterol Nebs PRN     3.   Sinus tachycardia   IVFs     4.   Lactic acidosis   IVFs     5.   Nausea and vomiting   PRN IV Zofran     6.   Hypokalemia - from #5   Replete K+ and Check Magnesium     7.   DVT Prophylaxis   Lovenox      Code Status:     FULL CODE        Family Communication:   Family at Bedside  Disposition Plan:    Observation Status        Time spent:  60 Minutes      Ron ParkerJENKINS,Shaakira Borrero C [facility] Pager [phone]   If 7AM -7PM Please Contact the Day Rounding Team MD for [facility]  If 7PM-7AM, Please Contact Night-Floor Coverage  [email or web address] Password [email or web address] 05/05/2014, 12:22 AM     ADDENDUM:   Patient was seen and examined on 05/05/2014

## 2014-05-05 NOTE — Progress Notes (Signed)
[facility]  Victoria Rivers is a 39 y.o. female [facility] after 2 days of fever chills, myalgias sore throat, cough, sore throat, and SOB.  Principal Problem:   Flu-like symptoms/  Sinus tachycardia/ Lactic acidosis - Influenza A positive- cont Tamiflu and supportive care  Active Problems:   Acute viral bronchitis - as above - start Tessalon    Nausea and vomiting - improving -cont Zofran PRN    Hypokalemia - continue to replace. Recheck this evening.  Calvert CantorSaima Niharika Savino, MD

## 2014-05-06 DIAGNOSIS — J208 Acute bronchitis due to other specified organisms: Secondary | ICD-10-CM

## 2014-05-06 LAB — BASIC METABOLIC PANEL
Anion gap: 5 (ref 5–15)
CALCIUM: 7.8 mg/dL — AB (ref 8.4–10.5)
CO2: 22 mmol/L (ref 19–32)
Chloride: 111 mmol/L (ref 96–112)
Creatinine, Ser: 0.76 mg/dL (ref 0.50–1.10)
GFR calc Af Amer: >90 mL/min (ref >90)
GFR calc non Af Amer: >90 mL/min (ref >90)
GLUCOSE: 96 mg/dL (ref 70–99)
POTASSIUM: 3.6 mmol/L (ref 3.5–5.1)
Sodium: 138 mmol/L (ref 135–145)

## 2014-05-06 MED ORDER — OSELTAMIVIR PHOSPHATE 75 MG PO CAPS: 75.0000 mg | ORAL_CAPSULE | Freq: Two times a day (BID) | ORAL | Status: DC

## 2014-05-06 MED ORDER — BENZONATATE 100 MG PO CAPS: 100.0000 mg | ORAL_CAPSULE | Freq: Three times a day (TID) | ORAL | Status: DC

## 2014-05-06 NOTE — Discharge Summary (Signed)
Physician Discharge Summary  Victoria Rivers ZOX:[id number] DOB: [birth year] DOA: 05/04/2014  PCP: Pcp Not In System  Admit date: 05/04/2014 Discharge date: 05/06/2014  Time spent: 45 minutes   Discharge Condition: stable Diet recommendation: regular diet  Discharge Diagnoses:  Principal Problem:   Influenza with respiratory manifestations Active Problems:   Acute viral bronchitis   Sinus tachycardia   Lactic acidosis   Nausea and vomiting   Hypokalemia   History of present illness:  Victoria Rivers is a 39 y.o. female who was seen and the [facility] and sent tot he ED after 2 days of fever chills, myalgias sore throat, cough, sore throat, and SOB. She also has had nausea and vomiting but no diarrhea. Her Significant other was diagnosed with the flu recently as well. She was tachycardic in the ED and was referred for admission.   Hospital Course:  Flu-like symptoms/ Sinus tachycardia/ Lactic acidosis - Influenza A positive-symptoms much improved - cont Tamiflu to complete a 5 day course- cont supportive care at home  Active Problems:  Acute viral bronchitis - as above - started Tessalon- improvement noted   Nausea and vomiting - improved- able to tolerate solid food   Hypokalemia - has been replaced adequately   Procedures:  Consultations:  none  Discharge Exam: Filed Weights   05/04/14 1853  Weight: 122.471 kg (270 lb)   Filed Vitals:   05/06/14 0511  BP: 91/74  Pulse: 86  Temp: 98.8 F (37.1 C)  Resp: 16    General: AAO x 3, no distress Cardiovascular: RRR, no murmurs  Respiratory: clear to auscultation bilaterally GI: soft, non-tender, non-distended, bowel sound positive  Discharge Instructions You were cared for by a hospitalist during your hospital stay. If you have any questions about your discharge medications or the care you received while you were in the hospital after you are discharged, you can call the unit and asked to speak  with the hospitalist on call if the hospitalist that took care of you is not available. Once you are discharged, your primary care physician will handle any further medical issues. Please note that NO REFILLS for any discharge medications will be authorized once you are discharged, as it is imperative that you return to your primary care physician (or establish a relationship with a primary care physician if you do not have one) for your aftercare needs so that they can reassess your need for medications and monitor your lab values.      Discharge Instructions    Diet - low sodium heart healthy    Complete by:  As directed      Increase activity slowly    Complete by:  As directed             Medication List    STOP taking these medications        metroNIDAZOLE 500 MG tablet  Commonly known as:  FLAGYL      TAKE these medications        acetaminophen 500 MG tablet  Commonly known as:  TYLENOL  Take 1,000 mg by mouth every 6 (six) hours as needed for moderate pain or fever.     benzonatate 100 MG capsule  Commonly known as:  TESSALON  Take 1 capsule (100 mg total) by mouth 3 (three) times daily.     oseltamivir 75 MG capsule  Commonly known as:  TAMIFLU  Take 1 capsule (75 mg total) by mouth 2 (two) times daily.  THERAFLU COLD & COUGH PO  Take 1 packet by mouth daily as needed (cold symptoms).       No Known Allergies    The results of significant diagnostics from this hospitalization (including imaging, microbiology, ancillary and laboratory) are listed below for reference.    Significant Diagnostic Studies: Dg Chest 2 View  05/04/2014   CLINICAL DATA:  Generalized body aches and fever.  EXAM: CHEST  2 VIEW  COMPARISON:  11/27/2008  FINDINGS: There is perihilar bronchial and interstitial thickening. No consolidation. The cardiomediastinal contours are normal. Pulmonary vasculature is normal. No pleural effusion or pneumothorax. No acute osseous abnormalities are  seen.  IMPRESSION: Perihilar bronchial and interstitial thickening most consistent with bronchitis or atypical viral infection.   Electronically Signed   By: Rubye OaksMelanie  Ehinger M.D.   On: 05/04/2014 22:08    Microbiology: No results found for this or any previous visit (from the past 240 hour(s)).   Labs: Basic Metabolic Panel:  Recent Labs Lab 05/04/14 1928 05/05/14 0810 05/05/14 1844 05/06/14 0558  NA 138 139 141 138  K 3.3* 2.9* 4.1 3.6  CL 102 106 112 111  CO2  --  25 25 22   GLUCOSE 113* 110* 94 96  BUN 11 6 5* <5*  CREATININE 1.60* 1.02 0.92 0.76  CALCIUM  --  7.9* 8.1* 7.8*   Liver Function Tests: No results for input(s): AST, ALT, ALKPHOS, BILITOT, PROT, ALBUMIN in the last 168 hours. No results for input(s): LIPASE, AMYLASE in the last 168 hours. No results for input(s): AMMONIA in the last 168 hours. CBC:  Recent Labs Lab 05/04/14 1920 05/04/14 1928 05/05/14 0810  WBC 8.7  --  5.3  NEUTROABS 8.1*  --   --   HGB 10.6* 12.6 9.3*  HCT 33.1* 37.0 30.0*  MCV 72.9*  --  73.7*  PLT 310  --  301   Cardiac Enzymes: No results for input(s): CKTOTAL, CKMB, CKMBINDEX, TROPONINI in the last 168 hours. BNP: BNP (last 3 results) No results for input(s): BNP in the last 8760 hours.  ProBNP (last 3 results) No results for input(s): PROBNP in the last 8760 hours.  CBG: No results for input(s): GLUCAP in the last 168 hours.     SignedCalvert Cantor:  Cesia Orf, MD [facility] 05/06/2014, 10:21 AM

## 2014-07-06 ENCOUNTER — Emergency Department (HOSPITAL_COMMUNITY): Payer: PRIVATE HEALTH INSURANCE

## 2014-07-06 ENCOUNTER — Emergency Department (HOSPITAL_COMMUNITY)
Admission: EM | Admit: 2014-07-06 | Discharge: 2014-07-06 | Disposition: A | Discharge status: Home / Self Care | Payer: PRIVATE HEALTH INSURANCE | Attending: Emergency Medicine | Admitting: Emergency Medicine

## 2014-07-06 ENCOUNTER — Encounter (HOSPITAL_COMMUNITY): Payer: Self-pay | Admitting: Emergency Medicine

## 2014-07-06 DIAGNOSIS — Z79899 Other long term (current) drug therapy: Secondary | ICD-10-CM | POA: Diagnosis not present

## 2014-07-06 DIAGNOSIS — S3992XA Unspecified injury of lower back, initial encounter: Secondary | ICD-10-CM | POA: Insufficient documentation

## 2014-07-06 DIAGNOSIS — M5136 Other intervertebral disc degeneration, lumbar region: Secondary | ICD-10-CM

## 2014-07-06 DIAGNOSIS — Z8742 Personal history of other diseases of the female genital tract: Secondary | ICD-10-CM | POA: Insufficient documentation

## 2014-07-06 DIAGNOSIS — Z720 Tobacco use: Secondary | ICD-10-CM | POA: Diagnosis not present

## 2014-07-06 DIAGNOSIS — Z8619 Personal history of other infectious and parasitic diseases: Secondary | ICD-10-CM | POA: Diagnosis not present

## 2014-07-06 DIAGNOSIS — Y9389 Activity, other specified: Secondary | ICD-10-CM | POA: Diagnosis not present

## 2014-07-06 DIAGNOSIS — Y92410 Unspecified street and highway as the place of occurrence of the external cause: Secondary | ICD-10-CM | POA: Diagnosis not present

## 2014-07-06 DIAGNOSIS — Y998 Other external cause status: Secondary | ICD-10-CM | POA: Diagnosis not present

## 2014-07-06 MED ORDER — CYCLOBENZAPRINE HCL 5 MG PO TABS: 5.0000 mg | ORAL_TABLET | Freq: Three times a day (TID) | ORAL | Status: DC | PRN

## 2014-07-06 MED ORDER — HYDROCODONE-ACETAMINOPHEN 5-325 MG PO TABS: 1.0000 | ORAL_TABLET | Freq: Four times a day (QID) | ORAL | Status: DC | PRN
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
## 2014-07-06 NOTE — Discharge Instructions (Signed)

## 2014-07-08 ENCOUNTER — Emergency Department (HOSPITAL_COMMUNITY): Payer: PRIVATE HEALTH INSURANCE

## 2014-07-08 ENCOUNTER — Encounter (HOSPITAL_COMMUNITY): Payer: Self-pay | Admitting: Emergency Medicine

## 2014-07-08 ENCOUNTER — Emergency Department (HOSPITAL_COMMUNITY)
Admission: EM | Admit: 2014-07-08 | Discharge: 2014-07-09 | Disposition: A | Discharge status: Home / Self Care | Payer: PRIVATE HEALTH INSURANCE | Attending: Emergency Medicine | Admitting: Emergency Medicine

## 2014-07-08 DIAGNOSIS — K8020 Calculus of gallbladder without cholecystitis without obstruction: Secondary | ICD-10-CM | POA: Insufficient documentation

## 2014-07-08 DIAGNOSIS — Z3202 Encounter for pregnancy test, result negative: Secondary | ICD-10-CM | POA: Insufficient documentation

## 2014-07-08 DIAGNOSIS — Z79899 Other long term (current) drug therapy: Secondary | ICD-10-CM | POA: Insufficient documentation

## 2014-07-08 DIAGNOSIS — K625 Hemorrhage of anus and rectum: Secondary | ICD-10-CM | POA: Insufficient documentation

## 2014-07-08 DIAGNOSIS — Z8619 Personal history of other infectious and parasitic diseases: Secondary | ICD-10-CM | POA: Insufficient documentation

## 2014-07-08 DIAGNOSIS — Z720 Tobacco use: Secondary | ICD-10-CM | POA: Insufficient documentation

## 2014-07-08 LAB — CBC WITH DIFFERENTIAL/PLATELET
BASOS ABS: 0 10*3/uL (ref 0.0–0.1)
Basophils Relative: 0 % (ref 0–1)
Eosinophils Absolute: 0.3 10*3/uL (ref 0.0–0.7)
Eosinophils Relative: 3 % (ref 0–5)
HCT: 37.4 % (ref 36.0–46.0)
HEMOGLOBIN: 11.7 g/dL — AB (ref 12.0–15.0)
Lymphocytes Relative: 31 % (ref 12–46)
Lymphs Abs: 2.9 10*3/uL (ref 0.7–4.0)
MCH: 23.3 pg — ABNORMAL LOW (ref 26.0–34.0)
MCHC: 31.3 g/dL (ref 30.0–36.0)
MCV: 74.4 fL — AB (ref 78.0–100.0)
MONO ABS: 0.6 10*3/uL (ref 0.1–1.0)
Monocytes Relative: 6 % (ref 3–12)
NEUTROS ABS: 5.6 10*3/uL (ref 1.7–7.7)
Neutrophils Relative %: 60 % (ref 43–77)
Platelets: 426 10*3/uL — ABNORMAL HIGH (ref 150–400)
RBC: 5.03 MIL/uL (ref 3.87–5.11)
RDW: 16.4 % — ABNORMAL HIGH (ref 11.5–15.5)
WBC: 9.3 10*3/uL (ref 4.0–10.5)

## 2014-07-08 LAB — I-STAT CHEM 8, ED
BUN: 12 mg/dL (ref 6–20)
CREATININE: 0.9 mg/dL (ref 0.44–1.00)
Calcium, Ion: 1.14 mmol/L (ref 1.12–1.23)
Chloride: 102 mmol/L (ref 101–111)
Glucose, Bld: 98 mg/dL (ref 70–99)
HCT: 39 % (ref 36.0–46.0)
HEMOGLOBIN: 13.3 g/dL (ref 12.0–15.0)
Potassium: 3.3 mmol/L — ABNORMAL LOW (ref 3.5–5.1)
Sodium: 141 mmol/L (ref 135–145)
TCO2: 22 mmol/L (ref 0–100)

## 2014-07-08 LAB — POC URINE PREG, ED: PREG TEST UR: NEGATIVE

## 2014-07-08 LAB — URINALYSIS, ROUTINE W REFLEX MICROSCOPIC
Bilirubin Urine: NEGATIVE
Glucose, UA: NEGATIVE mg/dL
Hgb urine dipstick: NEGATIVE
Ketones, ur: NEGATIVE mg/dL
Leukocytes, UA: NEGATIVE
NITRITE: NEGATIVE
Protein, ur: NEGATIVE mg/dL
SPECIFIC GRAVITY, URINE: 1.031 — AB (ref 1.005–1.030)
Urobilinogen, UA: 0.2 mg/dL (ref 0.0–1.0)
pH: 6 (ref 5.0–8.0)

## 2014-07-08 LAB — POC OCCULT BLOOD, ED: Fecal Occult Bld: POSITIVE — AB

## 2014-07-08 MED ORDER — IOHEXOL 300 MG/ML  SOLN
100.0000 mL | Freq: Once | INTRAMUSCULAR | Status: AC | PRN
  Administered 2014-07-08: 100 mL via INTRAVENOUS

## 2014-07-08 MED ORDER — ONDANSETRON 4 MG PO TBDP: 4.0000 mg | ORAL_TABLET | Freq: Three times a day (TID) | ORAL | Status: DC | PRN

## 2014-07-08 MED ORDER — IOHEXOL 300 MG/ML  SOLN
25.0000 mL | INTRAMUSCULAR | Status: AC
Start: 2014-07-08 — End: 2014-07-08
  Administered 2014-07-08: 25 mL via ORAL

## 2014-07-08 NOTE — Discharge Instructions (Signed)
CT shows that you have gallstones, but no inflammation of the gallbladder wall.  We will also has some edema in the right lower quadrant of your intestines but no obstruction.  No constipation.  The blood and he still may be results of your recent bout with diarrhea.  Please watch all stools in the future.  Make an appointment with our GI for further evaluation.  You have been given a prescription for Zofran to control nausea.  If you develop acute onset of abdominal pain, particularly after eating that is not controlled with over-the-counter Tylenol, ibuprofen, please return immediately to the emergency department for further evaluation
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
## 2014-07-08 NOTE — ED Notes (Signed)
Pt to ED c/o rectal bleeding onset today.  Pt st's after straining to have BM she noticed dark red blood in commode.  Pt also c/o abd cramping

## 2014-07-08 NOTE — ED Notes (Signed)
Pt c/o dark red blood in stools since today. Pt reports feeling constipated last week then had a bowel movement today with clots in the commode. Also endorses constant lower abdominal cramping, worsening with bowel movement

## 2015-12-20 ENCOUNTER — Inpatient Hospital Stay (HOSPITAL_COMMUNITY)
Admission: AD | Admit: 2015-12-20 | Discharge: 2015-12-20 | Disposition: A | Discharge status: Home / Self Care | Payer: Medicaid Other | Source: Ambulatory Visit | Attending: Obstetrics & Gynecology | Admitting: Obstetrics & Gynecology

## 2015-12-20 ENCOUNTER — Encounter (HOSPITAL_COMMUNITY): Payer: Self-pay | Admitting: *Deleted

## 2015-12-20 DIAGNOSIS — R102 Pelvic and perineal pain: Secondary | ICD-10-CM | POA: Diagnosis not present

## 2015-12-20 DIAGNOSIS — N939 Abnormal uterine and vaginal bleeding, unspecified: Secondary | ICD-10-CM | POA: Diagnosis not present

## 2015-12-20 DIAGNOSIS — N926 Irregular menstruation, unspecified: Secondary | ICD-10-CM | POA: Diagnosis present

## 2015-12-20 DIAGNOSIS — N760 Acute vaginitis: Secondary | ICD-10-CM | POA: Insufficient documentation

## 2015-12-20 DIAGNOSIS — B9689 Other specified bacterial agents as the cause of diseases classified elsewhere: Secondary | ICD-10-CM

## 2015-12-20 DIAGNOSIS — F17210 Nicotine dependence, cigarettes, uncomplicated: Secondary | ICD-10-CM | POA: Diagnosis not present

## 2015-12-20 HISTORY — DX: Anxiety disorder, unspecified: F41.9

## 2015-12-20 LAB — URINALYSIS, ROUTINE W REFLEX MICROSCOPIC
Bilirubin Urine: NEGATIVE
Glucose, UA: NEGATIVE mg/dL
Hgb urine dipstick: NEGATIVE
KETONES UR: 15 mg/dL — AB
Leukocytes, UA: NEGATIVE
NITRITE: NEGATIVE
Protein, ur: 30 mg/dL — AB
Specific Gravity, Urine: 1.015 (ref 1.005–1.030)
pH: 7.5 (ref 5.0–8.0)

## 2015-12-20 LAB — URINE MICROSCOPIC-ADD ON

## 2015-12-20 LAB — WET PREP, GENITAL
SPERM: NONE SEEN
Trich, Wet Prep: NONE SEEN
Yeast Wet Prep HPF POC: NONE SEEN

## 2015-12-20 LAB — POCT PREGNANCY, URINE: PREG TEST UR: NEGATIVE

## 2015-12-20 MED ORDER — METRONIDAZOLE 500 MG PO TABS: 500.0000 mg | ORAL_TABLET | Freq: Two times a day (BID) | ORAL | 2 refills | Status: DC

## 2015-12-20 NOTE — Discharge Instructions (Signed)
Ultrasound scheduled at [facility] on 12/27/15 at 10:45 am. Please drink 32 oz of water before appointment, they want you to have a full bladder.   [facility]   Offices that accept [employer] are in [place]  [facility]      Phone: [phone]  [facility] [place] Phone: [phone]  [facility] [place]  Phone: [phone]  [facility] [place]  Phone: [phone]  [facility] [place]  Phone: [phone]  [facility]       Phone: [phone]  [facility]    Phone: [phone]   [facility]    Phone: [phone]  [facility]    Phone: [phone]  [facility]    Phone: [phone]  [facility]    Phone: [phone]  [facility]       Phone: [phone]   [facility]  Phone: [phone]  [facility]   Phone: [phone]  [facility]   Phone: [phone]  [facility]      Phone: [phone]  [facility]    Phone: [phone]   Bacterial Vaginosis Bacterial vaginosis is a vaginal infection that occurs when the normal balance of bacteria in the vagina is disrupted. It results from an overgrowth of certain bacteria. This is the most common vaginal infection in women of childbearing age. Treatment is important to prevent complications, especially in pregnant women, as it can cause a premature delivery. CAUSES  Bacterial vaginosis is caused by an increase in harmful bacteria that are normally present in smaller amounts in the vagina. Several different kinds of bacteria can cause bacterial vaginosis. However, the reason that the condition  develops is not fully understood. RISK FACTORS Certain activities or behaviors can put you at an increased risk of developing bacterial vaginosis, including:  Having a new sex partner or multiple sex partners.  Douching.  Using an intrauterine device (IUD) for contraception. Women do not get bacterial vaginosis from toilet seats, bedding, swimming pools, or contact with objects around them. SIGNS AND SYMPTOMS  Some women with bacterial vaginosis have no signs or symptoms. Common symptoms include:  Grey vaginal discharge.  A fishlike odor with discharge, especially after sexual intercourse.  Itching or burning of the vagina and vulva.  Burning or pain with urination. DIAGNOSIS  Your health care provider will take a medical history and examine the vagina for signs of bacterial vaginosis. A sample of vaginal fluid may be taken. Your health care provider will look at this sample under a microscope to check for bacteria and abnormal cells. A vaginal pH test may also be done.  TREATMENT  Bacterial vaginosis may be treated with antibiotic medicines. These may be given in the form of a pill or a vaginal cream. A second round of antibiotics may be prescribed if the condition comes back after treatment. Because bacterial vaginosis increases your risk for sexually transmitted diseases, getting treated can help reduce your risk for chlamydia, gonorrhea, HIV, and herpes. HOME CARE INSTRUCTIONS   Only take over-the-counter or prescription medicines as directed by your health care provider.  If antibiotic medicine was prescribed, take it as directed. Make sure you finish it even if you start to feel better.  Tell all sexual partners that you have a vaginal infection. They should see  their health care provider and be treated if they have problems, such as a mild rash or itching.  During treatment, it is important that you follow these instructions:  Avoid sexual activity or use condoms  correctly.  Do not douche.  Avoid alcohol as directed by your health care provider.  Avoid breastfeeding as directed by your health care provider. SEEK MEDICAL CARE IF:   Your symptoms are not improving after 3 days of treatment.  You have increased discharge or pain.  You have a fever. MAKE SURE YOU:   Understand these instructions.  Will watch your condition.  Will get help right away if you are not doing well or get worse. FOR MORE INFORMATION  Centers for Disease Control and Prevention, Division of STD Prevention: [employer]co.za American Sexual Health Association (ASHA): www.ashastd.org    This information is not intended to replace advice given to you by your health care provider. Make sure you discuss any questions you have with your health care provider.   Document Released: 02/23/2005 Document Revised: 03/16/2014 Document Reviewed: 10/05/2012 Elsevier Interactive Patient Education 2016 Elsevier Inc.    Abnormal Uterine Bleeding Abnormal uterine bleeding can affect women at various stages in life, including teenagers, women in their reproductive years, pregnant women, and women who have reached menopause. Several kinds of uterine bleeding are considered abnormal, including:  Bleeding or spotting between periods.   Bleeding after sexual intercourse.   Bleeding that is heavier or more than normal.   Periods that last longer than usual.  Bleeding after menopause.  Many cases of abnormal uterine bleeding are minor and simple to treat, while others are more serious. Any type of abnormal bleeding should be evaluated by your health care provider. Treatment will depend on the cause of the bleeding. HOME CARE INSTRUCTIONS Monitor your condition for any changes. The following actions may help to alleviate any discomfort you are experiencing:  Avoid the use of tampons and douches as directed by your health care provider.  Change your pads frequently. You should  get regular pelvic exams and Pap tests. Keep all follow-up appointments for diagnostic tests as directed by your health care provider.  SEEK MEDICAL CARE IF:   Your bleeding lasts more than 1 week.   You feel dizzy at times.  SEEK IMMEDIATE MEDICAL CARE IF:   You pass out.   You are changing pads every 15 to 30 minutes.   You have abdominal pain.  You have a fever.   You become sweaty or weak.   You are passing large blood clots from the vagina.   You start to feel nauseous and vomit. MAKE SURE YOU:   Understand these instructions.  Will watch your condition.  Will get help right away if you are not doing well or get worse.   This information is not intended to replace advice given to you by your health care provider. Make sure you discuss any questions you have with your health care provider.   Document Released: 02/23/2005 Document Revised: 02/28/2013 Document Reviewed: 09/22/2012 Elsevier Interactive Patient Education [employer]

## 2015-12-20 NOTE — MAU Note (Signed)
Has a lot going on.  Having a little pain in lower abd, cramps, comes and goes past 2 wks.  Having d/c.  Skipped a period, past year has been skipping period. Has not done a home test.

## 2015-12-20 NOTE — MAU Provider Note (Signed)
[facility]U Attending Note  History     CSN: [id number]  Arrival date & time 12/20/15  1148   First Provider Initiated Contact with Patient 12/20/15 1318      Chief Complaint  Patient presents with  . skipped period  . Abdominal Pain  . Vaginal Discharge    Victoria GlimpseLatoya K Rivers is a 40 y.o. [id number] presenting to MAU today for evaluation of irregular menstrual bleeding with associated pelvic pain/cramping for several months.  Noticed having skipped periods since last year and when periods do come, it is associated with moderate cramping.  No current bleeding or abdominal pain.  Also has irritating, thin, vaginal discharge, has been using an African soap to clean her private area recently. Denies any fevers, chills, sweats, dysuria, nausea, vomiting, other GI or GU symptoms or other general symptoms.   Obstetric History   G7   P4   T4   P0   A3   L4    SAB2   TAB0   Ectopic1   Multiple0   Live Births4     # Outcome Date GA Lbr Len/2nd Weight Sex Delivery Anes PTL Lv  7 Ectopic 2013          6 Term 03/30/00    F Vag-Spont None  LIV  5 Term 08/31/98    F Vag-Spont None  LIV  4 TAB 1997          3 Term 04/10/94    F Vag-Spont None  LIV  2 TAB 1995          1 Term 03/12/92    M Vag-Spont None  LIV    Obstetric Comments  MTX for ectopic    Past Medical History:  Diagnosis Date  . Anxiety    no meds  . BV (bacterial vaginosis)   . Chlamydia   . Gonorrhea   . Infection    UTI  . Pregnancy, ectopic     Past Surgical History:  Procedure Laterality Date  . DILATION AND CURETTAGE OF UTERUS    . ECTOPIC PREGNANCY SURGERY     pt denies, states only received MTX  . THERAPEUTIC ABORTION      Family History  Problem Relation Age of Onset  . Hypertension Mother   . Sleep apnea Father   . Diabetes Maternal Grandmother   . Heart disease Maternal Grandmother   . Hypertension Maternal Grandmother   . Gout Maternal Grandmother   . Stroke Paternal Grandmother      Social History  Substance Use Topics  . Smoking status: Current Some Day Smoker    Types: Cigarettes  . Smokeless tobacco: Never Used  . Alcohol use No    No Known Allergies  Prescriptions Prior to Admission  Medication Sig Dispense Refill Last Dose  . acetaminophen (TYLENOL) 500 MG tablet Take 1,000 mg by mouth every 6 (six) hours as needed for moderate pain or fever.   05/03/2014 at Unknown time  . benzonatate (TESSALON) 100 MG capsule Take 1 capsule (100 mg total) by mouth 3 (three) times daily. 30 capsule 0   . cyclobenzaprine (FLEXERIL) 5 MG tablet Take 1 tablet (5 mg total) by mouth 3 (three) times daily as needed for muscle spasms. 15 tablet 0   . HYDROcodone-acetaminophen (NORCO/VICODIN) 5-325 MG per tablet Take 1-2 tablets by mouth every 6 (six) hours as needed. 10 tablet 0   . ondansetron (ZOFRAN ODT) 4 MG disintegrating tablet Take 1 tablet (4 mg total) by  mouth every 8 (eight) hours as needed for nausea or vomiting. 20 tablet 0   . oseltamivir (TAMIFLU) 75 MG capsule Take 1 capsule (75 mg total) by mouth 2 (two) times daily. 8 capsule 0   . Phenylephrine-Pheniramine-DM (THERAFLU COLD & COUGH PO) Take 1 packet by mouth daily as needed (cold symptoms).   05/03/2014 at Unknown time     Physical Exam  BP 134/75 (BP Location: Right Arm)   Pulse 95   Temp 97.5 F (36.4 C) (Oral)   Resp (!) 95   Wt 265 lb 12 oz (120.5 kg)   LMP  (LMP Unknown) Comment: thinks LMP was Aug  BMI 42.89 kg/m  GENERAL: Well-developed, well-nourished female in no acute distress  SKIN: Warm, dry and without erythema PSYCH: Normal mood and affect HEENT: Normocephalic, atraumatic.   LUNGS: Normal respiratory effort, normal breath sounds HEART: Regular rate noted ABDOMEN: Soft, obese, nondistended, nontender PELVIC: NEFG. Thin, white, foul-smelling discharge seen, cultures obtained. Normal vagina and cervix.  Mild RLQ tenderness on bimanual exam, unable to palpate uterus or adnexa secondary to  habitus.  EXTREMITIES: No edema, no cyanosis, normal range of movement  MAU Course/MDM  Cultures done. Outpatient ultrasound ordered for evaluation of AUB and pelvic pain  Labs and Imaging   Results for orders placed or performed during the hospital encounter of 12/20/15 (from the past 24 hour(s))  Urinalysis, Routine w reflex microscopic (not at [facility])     Status: Abnormal   Collection Time: 12/20/15 12:05 PM  Result Value Ref Range   Color, Urine YELLOW YELLOW   APPearance CLEAR CLEAR   Specific Gravity, Urine 1.015 1.005 - 1.030   pH 7.5 5.0 - 8.0   Glucose, UA NEGATIVE NEGATIVE mg/dL   Hgb urine dipstick NEGATIVE NEGATIVE   Bilirubin Urine NEGATIVE NEGATIVE   Ketones, ur 15 (A) NEGATIVE mg/dL   Protein, ur 30 (A) NEGATIVE mg/dL   Nitrite NEGATIVE NEGATIVE   Leukocytes, UA NEGATIVE NEGATIVE  Urine microscopic-add on     Status: Abnormal   Collection Time: 12/20/15 12:05 PM  Result Value Ref Range   Squamous Epithelial / LPF 0-5 (A) NONE SEEN   WBC, UA 0-5 0 - 5 WBC/hpf   RBC / HPF 0-5 0 - 5 RBC/hpf   Bacteria, UA FEW (A) NONE SEEN   Urine-Other MUCOUS PRESENT   Pregnancy, urine POC     Status: None   Collection Time: 12/20/15 12:10 PM  Result Value Ref Range   Preg Test, Ur NEGATIVE NEGATIVE  Wet prep, genital     Status: Abnormal   Collection Time: 12/20/15  1:25 PM  Result Value Ref Range   Yeast Wet Prep HPF POC NONE SEEN NONE SEEN   Trich, Wet Prep NONE SEEN NONE SEEN   Clue Cells Wet Prep HPF POC PRESENT (A) NONE SEEN   WBC, Wet Prep HPF POC FEW (A) NONE SEEN   Sperm NONE SEEN    No results found.  Assessment and Plan   1. BV (bacterial vaginosis)   2. Abnormal uterine bleeding (AUB)   3. Pelvic pain in female    Metronidazole prescribed for BV. Proper vulvar hygiene emphasized: discussed avoidance of perfumed soaps or African soap, detergents, lotions and any type of douches; in addition to wearing cotton underwear and no underwear at night.  Also  recommended cleaning front to back, voiding and cleaning up after intercourse.  Outpatient ultrasound scheduled 12/27/15 at 10:45 am; patient informed. She will choose a GYN provider  and follow up soon for pap smear and other evaluation for AUB; list given to patient to review and choose provider Was told to return to MAU for any pain, bleeding or other concerns, or if her condition were to change or worsen. Discharged to home in stable condition      Medication List    STOP taking these medications   benzonatate 100 MG capsule Commonly known as:  TESSALON   cyclobenzaprine 5 MG tablet Commonly known as:  FLEXERIL   HYDROcodone-acetaminophen 5-325 MG tablet Commonly known as:  NORCO/VICODIN   ondansetron 4 MG disintegrating tablet Commonly known as:  ZOFRAN ODT   oseltamivir 75 MG capsule Commonly known as:  TAMIFLU   THERAFLU COLD & COUGH PO     TAKE these medications   acetaminophen 500 MG tablet Commonly known as:  TYLENOL Take 1,000 mg by mouth every 6 (six) hours as needed for moderate pain or fever.   metroNIDAZOLE 500 MG tablet Commonly known as:  FLAGYL Take 1 tablet (500 mg total) by mouth 2 (two) times daily.        Jaynie Collins, MD, FACOG Attending Obstetrician & Gynecologist, [facility] for [employer], [facility]

## 2015-12-23 LAB — GC/CHLAMYDIA PROBE AMP (~~LOC~~) NOT AT ARMC
CHLAMYDIA, DNA PROBE: NEGATIVE
NEISSERIA GONORRHEA: NEGATIVE

## 2015-12-26 ENCOUNTER — Telehealth: Payer: Self-pay | Admitting: *Deleted

## 2015-12-26 NOTE — Telephone Encounter (Signed)
Melanie from [facility] [employer] called , needing precert for pelvic us. Precert obtained.

## 2015-12-27 ENCOUNTER — Ambulatory Visit (HOSPITAL_COMMUNITY)
Admit: 2015-12-27 | Discharge: 2015-12-27 | Disposition: A | Discharge status: Home / Self Care | Payer: Medicaid Other | Attending: Obstetrics & Gynecology | Admitting: Obstetrics & Gynecology

## 2015-12-27 DIAGNOSIS — N921 Excessive and frequent menstruation with irregular cycle: Secondary | ICD-10-CM | POA: Insufficient documentation

## 2015-12-27 DIAGNOSIS — R102 Pelvic and perineal pain: Secondary | ICD-10-CM | POA: Diagnosis not present

## 2015-12-27 DIAGNOSIS — N83201 Unspecified ovarian cyst, right side: Secondary | ICD-10-CM | POA: Insufficient documentation

## 2015-12-27 DIAGNOSIS — N939 Abnormal uterine and vaginal bleeding, unspecified: Secondary | ICD-10-CM

## 2016-07-07 ENCOUNTER — Inpatient Hospital Stay (HOSPITAL_COMMUNITY)
Admission: AD | Admit: 2016-07-07 | Discharge: 2016-07-07 | Disposition: A | Discharge status: Home / Self Care | Payer: Medicaid Other | Source: Ambulatory Visit | Attending: Family Medicine | Admitting: Family Medicine

## 2016-07-07 ENCOUNTER — Encounter (HOSPITAL_COMMUNITY): Payer: Self-pay | Admitting: Student

## 2016-07-07 DIAGNOSIS — Z823 Family history of stroke: Secondary | ICD-10-CM | POA: Diagnosis not present

## 2016-07-07 DIAGNOSIS — F419 Anxiety disorder, unspecified: Secondary | ICD-10-CM | POA: Insufficient documentation

## 2016-07-07 DIAGNOSIS — Z79899 Other long term (current) drug therapy: Secondary | ICD-10-CM | POA: Insufficient documentation

## 2016-07-07 DIAGNOSIS — N3000 Acute cystitis without hematuria: Secondary | ICD-10-CM | POA: Diagnosis not present

## 2016-07-07 DIAGNOSIS — R300 Dysuria: Secondary | ICD-10-CM

## 2016-07-07 DIAGNOSIS — Z833 Family history of diabetes mellitus: Secondary | ICD-10-CM | POA: Diagnosis not present

## 2016-07-07 DIAGNOSIS — Z113 Encounter for screening for infections with a predominantly sexual mode of transmission: Secondary | ICD-10-CM | POA: Diagnosis not present

## 2016-07-07 DIAGNOSIS — Z9889 Other specified postprocedural states: Secondary | ICD-10-CM | POA: Insufficient documentation

## 2016-07-07 DIAGNOSIS — Z8249 Family history of ischemic heart disease and other diseases of the circulatory system: Secondary | ICD-10-CM | POA: Insufficient documentation

## 2016-07-07 DIAGNOSIS — R350 Frequency of micturition: Secondary | ICD-10-CM | POA: Insufficient documentation

## 2016-07-07 DIAGNOSIS — F17210 Nicotine dependence, cigarettes, uncomplicated: Secondary | ICD-10-CM | POA: Diagnosis not present

## 2016-07-07 LAB — POCT PREGNANCY, URINE: PREG TEST UR: NEGATIVE

## 2016-07-07 LAB — URINALYSIS, ROUTINE W REFLEX MICROSCOPIC
Bilirubin Urine: NEGATIVE
Glucose, UA: NEGATIVE mg/dL
Hgb urine dipstick: NEGATIVE
KETONES UR: NEGATIVE mg/dL
Nitrite: NEGATIVE
PH: 6 (ref 5.0–8.0)
Protein, ur: NEGATIVE mg/dL
Specific Gravity, Urine: 1.023 (ref 1.005–1.030)

## 2016-07-07 LAB — WET PREP, GENITAL
SPERM: NONE SEEN
TRICH WET PREP: NONE SEEN
YEAST WET PREP: NONE SEEN

## 2016-07-07 MED ORDER — PHENAZOPYRIDINE HCL 200 MG PO TABS: 200.0000 mg | ORAL_TABLET | Freq: Three times a day (TID) | ORAL | 0 refills | Status: DC | PRN

## 2016-07-07 MED ORDER — SULFAMETHOXAZOLE-TRIMETHOPRIM 800-160 MG PO TABS: 1.0000 | ORAL_TABLET | Freq: Two times a day (BID) | ORAL | 0 refills | Status: DC

## 2016-07-07 NOTE — MAU Note (Signed)
Pt thought she had a UTI. Has been self treating with Cystex. States she has a lot of pain with urination and abdominal pain. States she has had some increase in frequency. Denies vag bleeding. LMP: 06/21/2016.

## 2016-07-07 NOTE — MAU Provider Note (Signed)
History     CSN: [id number]  Arrival date and time: 07/07/16 0017  First Provider Initiated Contact with Patient 07/07/16 0106      Chief Complaint  Patient presents with  . Abdominal Pain   Victoria Rivers is a 41 y.o. Non pregnant female who presents for dysuria & urinary frequency. Endorses dysuria with every urination, suprapubic cramping, & increased frequency. Denies fever/chills, n/v, vaginal discharge, vaginal bleeding, hematuria, or flank pain.  Pt is sexually active with 1 partner x 3 years. Does not use condoms. Wants to be swabbed for STD, no blood work. Denies dyspareunia or postcoital bleeding.    Dysuria   This is a new problem. Episode onset: x3 days. The problem occurs every urination. The problem has been unchanged. The quality of the pain is described as burning and aching. There has been no fever. She is sexually active. There is no history of pyelonephritis. Associated symptoms include frequency. Pertinent negatives include no chills, discharge, flank pain, hematuria, nausea or vomiting. She has tried home medications for the symptoms. The treatment provided no relief. There is no history of recurrent UTIs.   Past Medical History:  Diagnosis Date  . Anxiety    no meds  . BV (bacterial vaginosis)   . Chlamydia   . Gonorrhea   . Pregnancy, ectopic     Past Surgical History:  Procedure Laterality Date  . DILATION AND CURETTAGE OF UTERUS    . ECTOPIC PREGNANCY SURGERY     pt denies, states only received MTX  . THERAPEUTIC ABORTION      Family History  Problem Relation Age of Onset  . Hypertension Mother   . Sleep apnea Father   . Diabetes Maternal Grandmother   . Heart disease Maternal Grandmother   . Hypertension Maternal Grandmother   . Gout Maternal Grandmother   . Stroke Paternal Grandmother     Social History  Substance Use Topics  . Smoking status: Current Some Day Smoker    Types: Cigarettes  . Smokeless tobacco: Never Used  . Alcohol use No     Allergies: No Known Allergies  Prescriptions Prior to Admission  Medication Sig Dispense Refill Last Dose  . acetaminophen (TYLENOL) 500 MG tablet Take 1,000 mg by mouth every 6 (six) hours as needed for moderate pain or fever.   05/03/2014 at Unknown time  . metroNIDAZOLE (FLAGYL) 500 MG tablet Take 1 tablet (500 mg total) by mouth 2 (two) times daily. 14 tablet 2     Review of Systems  Constitutional: Negative.  Negative for chills.  Gastrointestinal: Negative for nausea and vomiting.  Genitourinary: Positive for dysuria and frequency. Negative for decreased urine volume, difficulty urinating, dyspareunia, flank pain, hematuria, vaginal bleeding and vaginal discharge.   Physical Exam   Blood pressure 135/68, pulse 87, temperature 97.7 F (36.5 C), temperature source Oral, resp. rate 20, height 5' 7.5" (1.715 m), weight 254 lb (115.2 kg), last menstrual period 06/21/2016.  Physical Exam  Nursing note and vitals reviewed. Constitutional: She is oriented to person, place, and time. She appears well-developed and well-nourished. No distress.  HENT:  Head: Normocephalic and atraumatic.  Eyes: Conjunctivae are normal. Right eye exhibits no discharge. Left eye exhibits no discharge. No scleral icterus.  Neck: Normal range of motion.  Cardiovascular: Normal rate, regular rhythm and normal heart sounds.   No murmur heard. Respiratory: Effort normal and breath sounds normal. No respiratory distress. She has no wheezes.  GI: Soft. Bowel sounds are normal. She exhibits  no distension. There is no tenderness. There is no CVA tenderness.  Neurological: She is alert and oriented to person, place, and time.  Skin: Skin is warm and dry. She is not diaphoretic.  Psychiatric: She has a normal mood and affect. Her behavior is normal. Judgment and thought content normal.    MAU Course  Procedures Results for orders placed or performed during the hospital encounter of 07/07/16 (from the past 24  hour(s))  Urinalysis, Routine w reflex microscopic (not at [facility])     Status: Abnormal   Collection Time: 07/07/16 12:24 AM  Result Value Ref Range   Color, Urine YELLOW YELLOW   APPearance CLOUDY (A) CLEAR   Specific Gravity, Urine 1.023 1.005 - 1.030   pH 6.0 5.0 - 8.0   Glucose, UA NEGATIVE NEGATIVE mg/dL   Hgb urine dipstick NEGATIVE NEGATIVE   Bilirubin Urine NEGATIVE NEGATIVE   Ketones, ur NEGATIVE NEGATIVE mg/dL   Protein, ur NEGATIVE NEGATIVE mg/dL   Nitrite NEGATIVE NEGATIVE   Leukocytes, UA LARGE (A) NEGATIVE   RBC / HPF 0-5 0 - 5 RBC/hpf   WBC, UA TOO NUMEROUS TO COUNT 0 - 5 WBC/hpf   Bacteria, UA RARE (A) NONE SEEN   Squamous Epithelial / LPF 0-5 (A) NONE SEEN  Pregnancy, urine POC     Status: None   Collection Time: 07/07/16 12:34 AM  Result Value Ref Range   Preg Test, Ur NEGATIVE NEGATIVE  Wet prep, genital     Status: Abnormal   Collection Time: 07/07/16  1:20 AM  Result Value Ref Range   Yeast Wet Prep HPF POC NONE SEEN NONE SEEN   Trich, Wet Prep NONE SEEN NONE SEEN   Clue Cells Wet Prep HPF POC PRESENT (A) NONE SEEN   WBC, Wet Prep HPF POC FEW (A) NONE SEEN   Sperm NONE SEEN     MDM UPT negative VSS, NAD GC/CT & wet prep Assessment and Plan  A; 1. Acute cystitis without hematuria   2. Screen for STD (sexually transmitted disease)   3. Dysuria    P: Discharge home Rx bactrim & pyridium If symptoms worsen or fever develops go to ED Keep follow up appointment with OB/PCP  GC/CT pending   Judeth Horn 07/07/2016, 1:05 AM

## 2016-07-07 NOTE — Discharge Instructions (Signed)

## 2016-07-08 LAB — GC/CHLAMYDIA PROBE AMP (~~LOC~~) NOT AT ARMC
Chlamydia: NEGATIVE
Neisseria Gonorrhea: NEGATIVE

## 2016-12-28 ENCOUNTER — Encounter (HOSPITAL_COMMUNITY): Payer: Self-pay

## 2016-12-28 ENCOUNTER — Inpatient Hospital Stay (HOSPITAL_COMMUNITY)
Admission: AD | Admit: 2016-12-28 | Discharge: 2016-12-28 | Disposition: A | Discharge status: Home / Self Care | Payer: PRIVATE HEALTH INSURANCE | Source: Ambulatory Visit | Attending: Obstetrics and Gynecology | Admitting: Obstetrics and Gynecology

## 2016-12-28 DIAGNOSIS — N760 Acute vaginitis: Secondary | ICD-10-CM | POA: Insufficient documentation

## 2016-12-28 DIAGNOSIS — Z3202 Encounter for pregnancy test, result negative: Secondary | ICD-10-CM | POA: Insufficient documentation

## 2016-12-28 DIAGNOSIS — B9689 Other specified bacterial agents as the cause of diseases classified elsewhere: Secondary | ICD-10-CM

## 2016-12-28 DIAGNOSIS — N912 Amenorrhea, unspecified: Secondary | ICD-10-CM | POA: Insufficient documentation

## 2016-12-28 DIAGNOSIS — N926 Irregular menstruation, unspecified: Secondary | ICD-10-CM

## 2016-12-28 DIAGNOSIS — F17210 Nicotine dependence, cigarettes, uncomplicated: Secondary | ICD-10-CM | POA: Insufficient documentation

## 2016-12-28 LAB — URINALYSIS, ROUTINE W REFLEX MICROSCOPIC
BACTERIA UA: NONE SEEN
BILIRUBIN URINE: NEGATIVE
Glucose, UA: NEGATIVE mg/dL
Hgb urine dipstick: NEGATIVE
Ketones, ur: NEGATIVE mg/dL
Nitrite: NEGATIVE
PH: 6 (ref 5.0–8.0)
Protein, ur: NEGATIVE mg/dL
SPECIFIC GRAVITY, URINE: 1.025 (ref 1.005–1.030)

## 2016-12-28 LAB — WET PREP, GENITAL
SPERM: NONE SEEN
TRICH WET PREP: NONE SEEN
Yeast Wet Prep HPF POC: NONE SEEN

## 2016-12-28 LAB — POCT PREGNANCY, URINE: Preg Test, Ur: NEGATIVE

## 2016-12-28 MED ORDER — METRONIDAZOLE 500 MG PO TABS: 500.0000 mg | ORAL_TABLET | Freq: Two times a day (BID) | ORAL | 0 refills | Status: DC

## 2016-12-28 NOTE — MAU Provider Note (Signed)
History     CSN: [id number]  Arrival date and time: 12/28/16 1010   None     Chief Complaint  Patient presents with  . Abdominal Pain   41 yo [id number] here for missed period. Last LMP was first week of August. Prior to this time, patient had irregular periods as well. Patient admits to low back pain and lower abdomina cramping. She is concerned for ectopic pregnancy, for which she has a history of ectopic pregnancy. Patient uses condoms for birth control but she is concerned for STI. Patient denies vaginal discharge and vaginal bleeding.     Past Medical History:  Diagnosis Date  . Anxiety    no meds  . BV (bacterial vaginosis)   . Chlamydia   . Gonorrhea   . Pregnancy, ectopic     Past Surgical History:  Procedure Laterality Date  . DILATION AND CURETTAGE OF UTERUS    . ECTOPIC PREGNANCY SURGERY     pt denies, states only received MTX  . THERAPEUTIC ABORTION      Family History  Problem Relation Age of Onset  . Hypertension Mother   . Sleep apnea Father   . Diabetes Maternal Grandmother   . Heart disease Maternal Grandmother   . Hypertension Maternal Grandmother   . Gout Maternal Grandmother   . Stroke Paternal Grandmother     Social History  Substance Use Topics  . Smoking status: Current Some Day Smoker    Types: Cigarettes  . Smokeless tobacco: Never Used  . Alcohol use No    Allergies: No Known Allergies  Prescriptions Prior to Admission  Medication Sig Dispense Refill Last Dose  . acetaminophen (TYLENOL) 500 MG tablet Take 1,000 mg by mouth every 6 (six) hours as needed for moderate pain or fever.   05/03/2014 at Unknown time  . phenazopyridine (PYRIDIUM) 200 MG tablet Take 1 tablet (200 mg total) by mouth 3 (three) times daily as needed for pain. 10 tablet 0   . sulfamethoxazole-trimethoprim (BACTRIM DS,SEPTRA DS) 800-160 MG tablet Take 1 tablet by mouth 2 (two) times daily. 10 tablet 0     Review of Systems  Constitutional: Negative for chills,  fatigue and fever.  HENT: Negative for ear discharge and ear pain.   Respiratory: Negative for chest tightness and shortness of breath.   Cardiovascular: Negative for chest pain and palpitations.  Gastrointestinal: Positive for abdominal pain. Negative for nausea and vomiting.  Endocrine: Negative for cold intolerance and heat intolerance.  Genitourinary: Negative for dysuria, vaginal bleeding and vaginal discharge.  Musculoskeletal: Positive for back pain. Negative for arthralgias.  Neurological: Negative for dizziness and numbness.   Physical Exam   Blood pressure 125/78, pulse 79, temperature 98 F (36.7 C), resp. rate 18, height 5\' 6"  (1.676 m), weight 254 lb (115.2 kg), last menstrual period 10/07/2016, SpO2 97 %.  Physical Exam  Constitutional: She is oriented to person, place, and time. She appears well-developed and well-nourished. No distress.  HENT:  Head: Normocephalic and atraumatic.  Eyes: Pupils are equal, round, and reactive to light. Conjunctivae and EOM are normal.  Neck: Normal range of motion. Neck supple.  Cardiovascular: Normal rate and intact distal pulses.   Respiratory: Effort normal. No respiratory distress.  GI: Soft. She exhibits no distension. There is no tenderness.  Musculoskeletal: Normal range of motion. She exhibits no edema.  Neurological: She is alert and oriented to person, place, and time.  Skin: Skin is warm and dry. No erythema.  Psychiatric: She has a  normal mood and affect. Her behavior is normal.    MAU Course  Procedures  MDM Urine pregnancy negative. Collected gc/chlamydia and wet prep.  Assessment and Plan  1. Missed period- urine preg negative. No further workup indicated. Follow up outpatient. 2. Bacterial vaginosis- treat with flagyl.   [employer] Corporationmber Cythia Bachtel 12/28/2016, 10:47 AM

## 2016-12-28 NOTE — Discharge Instructions (Signed)
Bacterial Vaginosis Bacterial vaginosis is an infection of the vagina. It happens when too many germs (bacteria) grow in the vagina. This infection puts you at risk for infections from sex (STIs). Treating this infection can lower your risk for some STIs. You should also treat this if you are pregnant. It can cause your baby to be born early. Follow these instructions at home: Medicines  Take over-the-counter and prescription medicines only as told by your doctor.  Take or use your antibiotic medicine as told by your doctor. Do not stop taking or using it even if you start to feel better. General instructions  If you your sexual partner is a woman, tell her that you have this infection. She needs to get treatment if she has symptoms. If you have a female partner, he does not need to be treated.  During treatment: ? Avoid sex. ? Do not douche. ? Avoid alcohol as told. ? Avoid breastfeeding as told.  Drink enough fluid to keep your pee (urine) clear or pale yellow.  Keep your vagina and butt (rectum) clean. ? Wash the area with warm water every day. ? Wipe from front to back after you use the toilet.  Keep all follow-up visits as told by your doctor. This is important. Preventing this condition  Do not douche.  Use only warm water to wash around your vagina.  Use protection when you have sex. This includes: ? Latex condoms. ? Dental dams.  Limit how many people you have sex with. It is best to only have sex with the same person (be monogamous).  Get tested for STIs. Have your partner get tested.  Wear underwear that is cotton or lined with cotton.  Avoid tight pants and pantyhose. This is most important in summer.  Do not use any products that have nicotine or tobacco in them. These include cigarettes and e-cigarettes. If you need help quitting, ask your doctor.  Do not use illegal drugs.  Limit how much alcohol you drink. Contact a doctor if:  Your symptoms do not  get better, even after you are treated.  You have more discharge or pain when you pee (urinate).  You have a fever.  You have pain in your belly (abdomen).  You have pain with sex.  Your bleed from your vagina between periods. Summary  This infection happens when too many germs (bacteria) grow in the vagina.  Treating this condition can lower your risk for some infections from sex (STIs).  You should also treat this if you are pregnant. It can cause early (premature) birth.  Do not stop taking or using your antibiotic medicine even if you start to feel better. This information is not intended to replace advice given to you by your health care provider. Make sure you discuss any questions you have with your health care provider. Document Released: 12/03/2007 Document Revised: 11/09/2015 Document Reviewed: 11/09/2015 Elsevier Interactive Patient Education  2017 [employer] Sexually Transmitted Disease A sexually transmitted disease (STD) is a disease or infection that may be passed (transmitted) from person to person, usually during sexual activity. This may happen by way of saliva, semen, blood, vaginal mucus, or urine. Common STDs include:  Gonorrhea.  Chlamydia.  Syphilis.  HIV and AIDS.  Genital herpes.  Hepatitis B and C.  Trichomonas.  Human papillomavirus (HPV).  Pubic lice.  Scabies.  Mites.  Bacterial vaginosis.  What are the causes? An STD may be caused by bacteria, a virus, or parasites. STDs are  often transmitted during sexual activity if one person is infected. However, they may also be transmitted through nonsexual means. STDs may be transmitted after:  Sexual intercourse with an infected person.  Sharing sex toys with an infected person.  Sharing needles with an infected person or using unclean piercing or tattoo needles.  Having intimate contact with the genitals, mouth, or rectal areas of an infected person.  Exposure to infected fluids  during birth.  What are the signs or symptoms? Different STDs have different symptoms. Some people may not have any symptoms. If symptoms are present, they may include:  Painful or bloody urination.  Pain in the pelvis, abdomen, vagina, anus, throat, or eyes.  A skin rash, itching, or irritation.  Growths, ulcerations, blisters, or sores in the genital and anal areas.  Abnormal vaginal discharge with or without bad odor.  Penile discharge in men.  Fever.  Pain or bleeding during sexual intercourse.  Swollen glands in the groin area.  Yellow skin and eyes (jaundice). This is seen with hepatitis.  Swollen testicles.  Infertility.  Sores and blisters in the mouth.  How is this diagnosed? To make a diagnosis, your health care provider may:  Take a medical history.  Perform a physical exam.  Take a sample of any discharge to examine.  Swab the throat, cervix, opening to the penis, rectum, or vagina for testing.  Test a sample of your first morning urine.  Perform blood tests.  Perform a Pap test, if this applies.  Perform a colposcopy.  Perform a laparoscopy.  How is this treated? Treatment depends on the STD. Some STDs may be treated but not cured.  Chlamydia, gonorrhea, trichomonas, and syphilis can be cured with antibiotic medicine.  Genital herpes, hepatitis, and HIV can be treated, but not cured, with prescribed medicines. The medicines lessen symptoms.  Genital warts from HPV can be treated with medicine or by freezing, burning (electrocautery), or surgery. Warts may come back.  HPV cannot be cured with medicine or surgery. However, abnormal areas may be removed from the cervix, vagina, or vulva.  If your diagnosis is confirmed, your recent sexual partners need treatment. This is true even if they are symptom-free or have a negative culture or evaluation. They should not have sex until their health care providers say it is okay.  Your health care  provider may test you for infection again 3 months after treatment.  How is this prevented? Take these steps to reduce your risk of getting an STD:  Use latex condoms, dental dams, and water-soluble lubricants during sexual activity. Do not use petroleum jelly or oils.  Avoid having multiple sex partners.  Do not have sex with someone who has other sex partners.  Do not have sex with anyone you do not know or who is at high risk for an STD.  Avoid risky sex practices that can break your skin.  Do not have sex if you have open sores on your mouth or skin.  Avoid drinking too much alcohol or taking illegal drugs. Alcohol and drugs can affect your judgment and put you in a vulnerable position.  Avoid engaging in oral and anal sex acts.  Get vaccinated for HPV and hepatitis. If you have not received these vaccines in the past, talk to your health care provider about whether one or both might be right for you.  If you are at risk of being infected with HIV, it is recommended that you take a prescription medicine daily to  prevent HIV infection. This is called pre-exposure prophylaxis (PrEP). You are considered at risk if: ? You are a man who has sex with other men (MSM). ? You are a heterosexual man or woman and are sexually active with more than one partner. ? You take drugs by injection. ? You are sexually active with a partner who has HIV.  Talk with your health care provider about whether you are at high risk of being infected with HIV. If you choose to begin PrEP, you should first be tested for HIV. You should then be tested every 3 months for as long as you are taking PrEP.  Contact a health care provider if:  See your health care provider.  Tell your sexual partner(s). They should be tested and treated for any STDs.  Do not have sex until your health care provider says it is okay. Get help right away if: Contact your health care provider right away if:  You have severe  abdominal pain.  You are a man and notice swelling or pain in your testicles.  You are a woman and notice swelling or pain in your vagina.  This information is not intended to replace advice given to you by your health care provider. Make sure you discuss any questions you have with your health care provider. Document Released: 05/16/2002 Document Revised: 09/13/2015 Document Reviewed: 09/13/2012 Elsevier Interactive Patient Education  2018 [employer].

## 2016-12-28 NOTE — MAU Note (Addendum)
Pt is having irregular periods and having abdominal and back pain 5/10. States she had an ectopic pregnancy before and it feels like that again, but has not taken a pregnancy test. States she is also seeing blood when she has a BM, had this problem in the past and never followed up with the specialist.  Also states she has a scant discharge with odor.

## 2016-12-29 LAB — GC/CHLAMYDIA PROBE AMP (~~LOC~~) NOT AT ARMC
Chlamydia: NEGATIVE
NEISSERIA GONORRHEA: NEGATIVE

## 2017-10-18 ENCOUNTER — Ambulatory Visit (INDEPENDENT_AMBULATORY_CARE_PROVIDER_SITE_OTHER): Payer: Self-pay

## 2017-10-18 ENCOUNTER — Ambulatory Visit (HOSPITAL_COMMUNITY)
Admission: EM | Admit: 2017-10-18 | Discharge: 2017-10-18 | Disposition: A | Discharge status: Home / Self Care | Payer: Self-pay | Attending: Family Medicine | Admitting: Family Medicine

## 2017-10-18 ENCOUNTER — Encounter (HOSPITAL_COMMUNITY): Payer: Self-pay

## 2017-10-18 DIAGNOSIS — S66911A Strain of unspecified muscle, fascia and tendon at wrist and hand level, right hand, initial encounter: Secondary | ICD-10-CM

## 2017-10-18 MED ORDER — NAPROXEN 500 MG PO TABS: 500.0000 mg | ORAL_TABLET | Freq: Two times a day (BID) | ORAL | 0 refills | Status: DC

## 2017-10-18 MED ORDER — IBUPROFEN 800 MG PO TABS
ORAL_TABLET | ORAL | Status: AC
  Filled 2017-10-18: qty 1

## 2017-10-18 MED ORDER — IBUPROFEN 800 MG PO TABS
800.0000 mg | ORAL_TABLET | Freq: Once | ORAL | Status: AC
Start: 2017-10-18 — End: 2017-10-18
  Administered 2017-10-18: 800 mg via ORAL

## 2017-10-18 NOTE — ED Triage Notes (Signed)
Pt presents with right hand swelling and pain from turn the wrong way , she has no good grasp or grip with that hand

## 2017-10-18 NOTE — Discharge Instructions (Signed)
Ice, elevation, use of ace wrap.  Naproxen twice a day, take with food.  Follow up with hand surgeon as needed if no improvement in the next week.

## 2017-10-18 NOTE — ED Provider Notes (Signed)
[facility]    CSN: [id number] Arrival date & time: 10/18/17  1951     History   Chief Complaint Chief Complaint  Patient presents with  . Hand Problem    Right     HPI Victoria Rivers is a 42 y.o. female.   Victoria Rivers presents with complaints of right hand pain after she reached back behind her to then turn a door knob, twisting her hand, causing a pop followed by pain. This was two nights ago. Swelling has persisted and generalized pain. Took ibuprofen last night which did not help. She is right handed. No previous hand injury. No numbness or tingling. Pain with making a fist to MTP joints of second-fourth fingers and pain to dorsal hand, generalized. No redness. No fevers. No history of gout or kidney stones.  Does not follow with a primary care provider. Without contributing medical history.     ROS per HPI.      Past Medical History:  Diagnosis Date  . Anxiety    no meds  . BV (bacterial vaginosis)   . Chlamydia   . Gonorrhea   . Pregnancy, ectopic     Patient Active Problem List   Diagnosis Date Noted  . Acute viral bronchitis 05/05/2014  . Sinus tachycardia 05/05/2014  . Lactic acidosis 05/05/2014  . Nausea and vomiting 05/05/2014  . Hypokalemia 05/05/2014  . Influenza with respiratory manifestations     Past Surgical History:  Procedure Laterality Date  . DILATION AND CURETTAGE OF UTERUS    . ECTOPIC PREGNANCY SURGERY     pt denies, states only received MTX  . THERAPEUTIC ABORTION      OB History    Gravida  7   Para  4   Term  4   Preterm      AB  3   Living  4     SAB      TAB  2   Ectopic  1   Multiple      Live Births  4        Obstetric Comments  MTX for ectopic         Home Medications    Prior to Admission medications   Medication Sig Start Date End Date Taking? Authorizing Provider  acetaminophen (TYLENOL) 325 MG tablet Take 650 mg by mouth every 6 (six) hours as needed for moderate pain or fever.     [provider]  acetaminophen (TYLENOL) 500 MG tablet Take 1,000 mg by mouth every 6 (six) hours as needed for moderate pain or fever.    [provider]  metroNIDAZOLE (FLAGYL) 500 MG tablet Take 1 tablet (500 mg total) by mouth 2 (two) times daily. 12/28/16   Moss, Hospital doctorAmber, DO  Multiple Vitamin (MULTIVITAMIN WITH MINERALS) TABS tablet Take 1 tablet by mouth daily.    [provider]  naproxen (NAPROSYN) 500 MG tablet Take 1 tablet (500 mg total) by mouth 2 (two) times daily. 10/18/17   Georgetta HaberBurky, Natalie B, NP  diphenhydrAMINE (BENADRYL) 25 mg capsule Take 50 mg by mouth 2 (two) times daily as needed. For itching   06/16/11  [provider]    Family History Family History  Problem Relation Age of Onset  . Hypertension Mother   . Sleep apnea Father   . Diabetes Maternal Grandmother   . Heart disease Maternal Grandmother   . Hypertension Maternal Grandmother   . Gout Maternal Grandmother   . Stroke Paternal Grandmother  Social History Social History   Tobacco Use  . Smoking status: Current Some Day Smoker    Types: Cigarettes  . Smokeless tobacco: Never Used  Substance Use Topics  . Alcohol use: No  . Drug use: No     Allergies   Morphine and related   Review of Systems Review of Systems   Physical Exam Triage Vital Signs ED Triage Vitals  Enc Vitals Group     BP 10/18/17 2012 130/75     Pulse Rate 10/18/17 2012 88     Resp 10/18/17 2012 20     Temp 10/18/17 2012 98.4 F (36.9 C)     Temp Source 10/18/17 2012 Temporal     SpO2 10/18/17 2012 99 %     Weight --      Height --      Head Circumference --      Peak Flow --      Pain Score 10/18/17 2010 8     Pain Loc --      Pain Edu? --      Excl. in GC? --    No data found.  Updated Vital Signs BP 130/75 (BP Location: Left Arm)   Pulse 88   Temp 98.4 F (36.9 C) (Temporal)   Resp 20   LMP 10/02/2017   SpO2 99%   Visual Acuity Right Eye Distance:   Left Eye  Distance:   Bilateral Distance:    Right Eye Near:   Left Eye Near:    Bilateral Near:     Physical Exam  Constitutional: She is oriented to person, place, and time. She appears well-developed and well-nourished. No distress.  Cardiovascular: Normal rate, regular rhythm and normal heart sounds.  Pulmonary/Chest: Effort normal and breath sounds normal.  Musculoskeletal:       Right elbow: Normal.      Right wrist: Normal.       Right hand: She exhibits decreased range of motion, tenderness, bony tenderness and swelling. She exhibits normal two-point discrimination, normal capillary refill, no deformity and no laceration. Normal sensation noted. Decreased strength noted. She exhibits finger abduction and thumb/finger opposition.       Hands: Gross sensation intact; ROM limited by pain and swelling; pain primarily to 2-4th MTP joints and to dorsal hand primarily over 2-4th metatarsals; no redness; generalized swelling; can flex and extend fingers but with pain to MTP's; cap refill < 2 seconds; strong radial pulse; normal ROM to wrist without pain   Neurological: She is alert and oriented to person, place, and time.  Skin: Skin is warm and dry.     UC Treatments / Results  Labs (all labs ordered are listed, but only abnormal results are displayed) Labs Reviewed - No data to display  EKG None  Radiology Dg Hand Complete Right  Result Date: 10/18/2017 CLINICAL DATA:  Right hand pain and swelling for 2 days, no known injury, initial encounter EXAM: RIGHT HAND - COMPLETE 3+ VIEW COMPARISON:  05/29/2007 FINDINGS: There is no evidence of fracture or dislocation. There is no evidence of arthropathy or other focal bone abnormality. Soft tissue swelling is noted. IMPRESSION: Mild soft tissue swelling consistent with the given clinical history. No acute bony abnormality is seen. Electronically Signed   By: Alcide Clever M.D.   On: 10/18/2017 20:49    Procedures Procedures (including critical  care time)  Medications Ordered in UC Medications  ibuprofen (ADVIL,MOTRIN) tablet 800 mg (800 mg Oral Given 10/18/17 2046)  Initial Impression / Assessment and Plan / UC Course  I have reviewed the triage vital signs and the nursing notes.  Pertinent labs & imaging results that were available during my care of the patient were reviewed by me and considered in my medical decision making (see chart for details).     Xray consistent with clinic story. Consistent with a strain/tendonitis. Generalized pain and swelling. Ace wrap, elevation, ice application. Naproxen twice a day, take with food. Follow up with hand if no improvement in the next week.  Patient verbalized understanding and agreeable to plan.    Final Clinical Impressions(s) / UC Diagnoses   Final diagnoses:  Hand strain, right, initial encounter     Discharge Instructions     Ice, elevation, use of ace wrap.  Naproxen twice a day, take with food.  Follow up with hand surgeon as needed if no improvement in the next week.     ED Prescriptions    Medication Sig Dispense Auth. Provider   naproxen (NAPROSYN) 500 MG tablet Take 1 tablet (500 mg total) by mouth 2 (two) times daily. 30 tablet Georgetta HaberBurky, Natalie B, NP     Controlled Substance Prescriptions [place] Controlled Substance Registry consulted? Not Applicable   Georgetta HaberBurky, Natalie B, NP 10/18/17 2054

## 2017-10-21 ENCOUNTER — Other Ambulatory Visit: Payer: Self-pay

## 2017-10-21 ENCOUNTER — Encounter (HOSPITAL_COMMUNITY): Payer: Self-pay

## 2017-10-21 ENCOUNTER — Ambulatory Visit (HOSPITAL_COMMUNITY)
Admission: EM | Admit: 2017-10-21 | Discharge: 2017-10-21 | Disposition: A | Discharge status: Home / Self Care | Payer: PRIVATE HEALTH INSURANCE | Attending: Family Medicine | Admitting: Family Medicine

## 2017-10-21 DIAGNOSIS — M659 Synovitis and tenosynovitis, unspecified: Secondary | ICD-10-CM

## 2017-10-21 DIAGNOSIS — M79641 Pain in right hand: Secondary | ICD-10-CM

## 2017-10-21 MED ORDER — METHYLPREDNISOLONE 4 MG PO TBPK: ORAL_TABLET | ORAL | 0 refills | Status: DC

## 2017-10-21 NOTE — ED Triage Notes (Signed)
Pt needs a work note. Hand is very painful. Pt still taking meds.

## 2017-10-21 NOTE — Discharge Instructions (Addendum)
Ice Limit use of hand Stop the naproxen Take the medrol as directed Take all of day one today May resume the naprosyn after medrol done

## 2017-10-21 NOTE — ED Provider Notes (Signed)
[facility]    CSN: [id number] Arrival date & time: 10/21/17  1905     History   Chief Complaint Chief Complaint  Patient presents with  . Hand Pain    HPI Victoria Rivers is a 42 y.o. female.   HPI  Patient has tenosynovitis of her right hand.  She is right-handed.  She states that she was unable to go to work today because her hand was still painful.  She is taking naproxen twice a day.  She has been wearing the brace.  She is been using ice.  She has been trying to limit use of her hand. She states she is still having trouble using her hand, gripping, and bending her fingers. We discussed tenosynovitis.  If the Naprosyn is not strong enough I am going to give her a Medrol Dosepak.  I rewrapped her hand.  I told her to stay off until Monday.  I anticipate that it would be better with a couple more days of treatment.  Past Medical History:  Diagnosis Date  . Anxiety    no meds  . BV (bacterial vaginosis)   . Chlamydia   . Gonorrhea   . Pregnancy, ectopic     Patient Active Problem List   Diagnosis Date Noted  . Acute viral bronchitis 05/05/2014  . Sinus tachycardia 05/05/2014  . Lactic acidosis 05/05/2014  . Nausea and vomiting 05/05/2014  . Hypokalemia 05/05/2014  . Influenza with respiratory manifestations     Past Surgical History:  Procedure Laterality Date  . DILATION AND CURETTAGE OF UTERUS    . ECTOPIC PREGNANCY SURGERY     pt denies, states only received MTX  . THERAPEUTIC ABORTION      OB History    Gravida  7   Para  4   Term  4   Preterm      AB  3   Living  4     SAB      TAB  2   Ectopic  1   Multiple      Live Births  4        Obstetric Comments  MTX for ectopic         Home Medications    Prior to Admission medications   Medication Sig Start Date End Date Taking? Authorizing Provider  acetaminophen (TYLENOL) 325 MG tablet Take 650 mg by mouth every 6 (six) hours as needed for moderate pain or fever.     [provider]  acetaminophen (TYLENOL) 500 MG tablet Take 1,000 mg by mouth every 6 (six) hours as needed for moderate pain or fever.    [provider]  methylPREDNISolone (MEDROL DOSEPAK) 4 MG TBPK tablet tad 10/21/17   Eustace Moore, MD  metroNIDAZOLE (FLAGYL) 500 MG tablet Take 1 tablet (500 mg total) by mouth 2 (two) times daily. 12/28/16   Moss, Hospital doctor, DO  Multiple Vitamin (MULTIVITAMIN WITH MINERALS) TABS tablet Take 1 tablet by mouth daily.    [provider]  naproxen (NAPROSYN) 500 MG tablet Take 1 tablet (500 mg total) by mouth 2 (two) times daily. 10/18/17   Georgetta Haber, NP    Family History Family History  Problem Relation Age of Onset  . Hypertension Mother   . Sleep apnea Father   . Diabetes Maternal Grandmother   . Heart disease Maternal Grandmother   . Hypertension Maternal Grandmother   . Gout Maternal Grandmother   . Stroke Paternal Grandmother  Social History Social History   Tobacco Use  . Smoking status: Current Some Day Smoker    Types: Cigarettes  . Smokeless tobacco: Never Used  Substance Use Topics  . Alcohol use: No  . Drug use: No     Allergies   Morphine and related   Review of Systems Review of Systems  Constitutional: Negative for chills and fever.  HENT: Negative for ear pain and sore throat.   Eyes: Negative for pain and visual disturbance.  Respiratory: Negative for cough and shortness of breath.   Cardiovascular: Negative for chest pain and palpitations.  Gastrointestinal: Negative for abdominal pain and vomiting.  Genitourinary: Negative for dysuria and hematuria.  Musculoskeletal: Negative for arthralgias and back pain.       Hand pain  Skin: Negative for color change and rash.  Neurological: Negative for seizures and syncope.  All other systems reviewed and are negative.    Physical Exam Triage Vital Signs ED Triage Vitals  Enc Vitals Group     BP 10/21/17 2008 134/88     Pulse  Rate 10/21/17 2008 89     Resp 10/21/17 2008 18     Temp 10/21/17 2008 97.9 F (36.6 C)     Temp src --      SpO2 10/21/17 2008 99 %     Weight 10/21/17 2006 266 lb (120.7 kg)     Height --      Head Circumference --      Peak Flow --      Pain Score 10/21/17 2005 7     Pain Loc --      Pain Edu? --      Excl. in GC? --    No data found.  Updated Vital Signs BP 134/88   Pulse 89   Temp 97.9 F (36.6 C)   Resp 18   Wt 120.7 kg   LMP 10/02/2017   SpO2 99%   BMI 42.93 kg/m   Visual Acuity Right Eye Distance:   Left Eye Distance:   Bilateral Distance:    Right Eye Near:   Left Eye Near:    Bilateral Near:     Physical Exam  Constitutional: She appears well-developed and well-nourished. No distress.  HENT:  Head: Normocephalic and atraumatic.  Mouth/Throat: Oropharynx is clear and moist.  Eyes: Pupils are equal, round, and reactive to light. Conjunctivae are normal.  Neck: Normal range of motion.  Cardiovascular: Normal rate.  Pulmonary/Chest: Effort normal. No respiratory distress.  Abdominal: Soft. She exhibits no distension.  Musculoskeletal: Normal range of motion. She exhibits no edema.  Dorsum of hand has diffuse swelling.  Tenderness.  Pain with flexion and extension of fingers.  Normal distal sensation and cap refill  Neurological: She is alert.  Skin: Skin is warm and dry.     UC Treatments / Results  Labs (all labs ordered are listed, but only abnormal results are displayed) Labs Reviewed - No data to display  EKG None  Radiology No results found.  Procedures Procedures (including critical care time)  Medications Ordered in UC Medications - No data to display  Initial Impression / Assessment and Plan / UC Course  I have reviewed the triage vital signs and the nursing notes.  Pertinent labs & imaging results that were available during my care of the patient were reviewed by me and considered in my medical decision making (see chart for  details).     Wrapped with cotton padding/Ace wrap for support Final  Clinical Impressions(s) / UC Diagnoses   Final diagnoses:  Tenosynovitis of right hand     Discharge Instructions     Ice Limit use of hand Stop the naproxen Take the medrol as directed Take all of day one today May resume the naprosyn after medrol done     ED Prescriptions    Medication Sig Dispense Auth. Provider   methylPREDNISolone (MEDROL DOSEPAK) 4 MG TBPK tablet tad 21 tablet Eustace MooreNelson, Viridiana Spaid Sue, MD     Controlled Substance Prescriptions [place] Controlled Substance Registry consulted? Not Applicable   Eustace MooreNelson, Wanna Gully Sue, MD 10/21/17 [phone]

## 2018-01-04 ENCOUNTER — Ambulatory Visit: Payer: Medicaid Other | Admitting: Primary Care

## 2018-06-17 ENCOUNTER — Emergency Department (HOSPITAL_COMMUNITY)
Admission: EM | Admit: 2018-06-17 | Discharge: 2018-06-17 | Disposition: A | Discharge status: Home / Self Care | Payer: Medicaid Other | Attending: Emergency Medicine | Admitting: Emergency Medicine

## 2018-06-17 ENCOUNTER — Other Ambulatory Visit: Payer: Self-pay

## 2018-06-17 DIAGNOSIS — Z79899 Other long term (current) drug therapy: Secondary | ICD-10-CM | POA: Insufficient documentation

## 2018-06-17 DIAGNOSIS — F17210 Nicotine dependence, cigarettes, uncomplicated: Secondary | ICD-10-CM | POA: Insufficient documentation

## 2018-06-17 DIAGNOSIS — Z0289 Encounter for other administrative examinations: Secondary | ICD-10-CM

## 2018-06-17 NOTE — Discharge Instructions (Signed)
You have been seen today for a work note. Please read and follow all provided instructions.   1. Medications: usual home medications 2. Treatment: rest, drink plenty of fluids 3. Follow Up: Please follow up with your primary doctor in 7 days for discussion of your diagnoses and further evaluation after today's visit; if you do not have a primary care doctor use the resource guide provided to find one; Please return to the ER for any new or worsening symptoms. Please obtain all of your results from medical records or have your doctors office obtain the results - share them with your doctor - you should be seen at your doctors office. Call today to arrange your follow up.   Take medications as prescribed. Please review all of the medicines and only take them if you do not have an allergy to them. Return to the emergency room for worsening condition or new concerning symptoms. Follow up with your regular doctor. If you don't have a regular doctor use one of the numbers below to establish a primary care doctor.  Please be aware that if you are taking birth control pills, taking other prescriptions, ESPECIALLY ANTIBIOTICS may make the birth control ineffective - if this is the case, either do not engage in sexual activity or use alternative methods of birth control such as condoms until you have finished the medicine and your family doctor says it is OK to restart them. If you are on a blood thinner such as COUMADIN, be aware that any other medicine that you take may cause the coumadin to either work too much, or not enough - you should have your coumadin level rechecked in next 7 days if this is the case.  ?  It is also a possibility that you have an allergic reaction to any of the medicines that you have been prescribed - Everybody reacts differently to medications and while MOST people have no trouble with most medicines, you may have a reaction such as nausea, vomiting, rash, swelling, shortness of breath.  If this is the case, please stop taking the medicine immediately and contact your physician.  ?  You should return to the ER if you develop severe or worsening symptoms.   Emergency Department Resource Guide 1) Find a Doctor and Pay Out of Pocket Although you won't have to find out who is covered by your insurance plan, it is a good idea to ask around and get recommendations. You will then need to call the office and see if the doctor you have chosen will accept you as a new patient and what types of options they offer for patients who are self-pay. Some doctors offer discounts or will set up payment plans for their patients who do not have insurance, but you will need to ask so you aren't surprised when you get to your appointment.  2) Contact Your Local Health Department Not all health departments have doctors that can see patients for sick visits, but many do, so it is worth a call to see if yours does. If you don't know where your local health department is, you can check in your phone book. The CDC also has a tool to help you locate your state's health department, and many state websites also have listings of all of their local health departments.  3) Find a Walk-in Clinic If your illness is not likely to be very severe or complicated, you may want to try a walk in clinic. These are popping up all  over the country in pharmacies, drugstores, and shopping centers. They're usually staffed by nurse practitioners or physician assistants that have been trained to treat common illnesses and complaints. They're usually fairly quick and inexpensive. However, if you have serious medical issues or chronic medical problems, these are probably not your best option.  No Primary Care Doctor: Call Health Connect at  [phone] - they can help you locate a primary care doctor that  accepts your insurance, provides certain services, etc. Physician Referral Service- [phone]  Chronic Pain  Problems: Organization         Address  Phone   Notes  [facility]  [phone] Patients need to be referred by their primary care doctor.   Medication Assistance: Organization         Address  Phone   Notes  [facility] Medication [facility] [address] [place], [place] [address] [phone] --Must be a resident of [facility] -- Must have NO insurance coverage whatsoever (no Medicaid/ Medicare, etc.) -- The pt. MUST have a primary care doctor that directs their care regularly and follows them in the community   [facility]  [phone]   [employer]  [phone]    Agencies that provide inexpensive medical care: Organization         Address  Phone   Notes  [place]  [phone]   [facility]    [phone]   [facility] [place], [place] [address] [phone]   [place] [address], [place] [phone]   [facility]    [phone]   [facility]    [phone]   [place] and [place] [address], [place] Phone:  [phone], Fax:  [phone] Hours of Operation:  9 am - 6 pm, M-F.  Also accepts Medicaid/Medicare and self-pay.  [facility] for [address], [address], [place] Phone: [phone], Fax: [phone]. Hours of Operation:  8:30 am - 5:30 pm, M-F.  Also accepts Medicaid and self-pay.  [facility] [place] [address], [place] Phone: [phone]   [place], [place], [place] [phone], Ext. [phone] Mondays & Thursdays: 7-9 AM.  First 15 patients are seen on a first come, first serve basis.    [place] Providers:  Organization         Address  Phone   Notes  [facility] [address], [place] [phone] Also  accepts self-pay patients.  [facility] [address], [place]  [phone]   [place], [address], [place] [phone]   [facility] [address] [place] [phone]   Lucianne Lei [address], [place]   [phone] Only accepts [place] patients after they have their name applied to their card.   Self-Pay (no insurance) in [facility]:  Organization         Address  Phone   Notes  Sickle Cell Patients, [facility] [place] [phone]   [facility] [place] [phone]   [facility] [place]  [address], [place] [phone]   °[facility]/Dr. Osei-Bonsu ° [address] [place] [address], [place] or [address], [place] [phone] Phone number for both [place] and [place] locations is the same.  °[facility] [address], [place] [phone]   °[facility] [address] [place] [address], [place] or [address] [phone] °[phone]   °[facility] [address],  [phone], phone; [phone], fax Sees patients 1st and 3rd Saturday of every month.  Must not qualify for public or private insurance (i.e. Medicaid, Medicare, [place] Health Choice, Veterans' Benefits)  Household income should be no more than 200% of the poverty level The clinic cannot treat you if you are pregnant or think you are pregnant  Sexually transmitted diseases are not treated at the clinic.  °  ° °

## 2018-06-17 NOTE — ED Provider Notes (Signed)
[facility] EMERGENCY DEPARTMENT Provider Note   CSN: [id number] Arrival date & time: 06/17/18  1206    History   Chief Complaint Chief Complaint  Patient presents with  . Work Note    HPI Victoria Rivers is a 43 y.o. female with a PMH of anxiety, BV, Chlamydia, and Gonorrhea presenting for a work note. Patient states her friend had another friend that had a cough and patient was concerned about possible COVID-19. Patient reports she has quarantined at home for the last 2 weeks and states she has not had any symptoms. Patient states her friend's exposure had seasonal allergies. Patient denies fever, cough, congestion, headache, vomiting, diarrhea, or abdominal pain. Patient denies any other exposure. Patient denies recent travel. 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      HPI  Past Medical History:  Diagnosis Date  . Anxiety    no meds  . BV (bacterial vaginosis)   . Chlamydia   . Gonorrhea   . Pregnancy, ectopic     Patient Active Problem List   Diagnosis Date Noted  . Acute viral bronchitis 05/05/2014  . Sinus tachycardia 05/05/2014  . Lactic acidosis 05/05/2014  . Nausea and vomiting 05/05/2014  . Hypokalemia 05/05/2014  . Influenza with respiratory manifestations     Past Surgical History:  Procedure Laterality Date  . DILATION AND CURETTAGE OF UTERUS    . ECTOPIC PREGNANCY SURGERY     pt denies, states only received MTX  . THERAPEUTIC ABORTION       OB History    Gravida  7   Para  4   Term  4   Preterm      AB  3   Living  4     SAB      TAB  2   Ectopic  1   Multiple      Live Births  4        Obstetric Comments  MTX for ectopic         Home Medications    Prior to Admission medications   Medication Sig Start Date End Date Taking? Authorizing Provider  acetaminophen (TYLENOL) 325 MG tablet Take 650 mg by mouth every 6 (six) hours as needed for  moderate pain or fever.    [provider]  acetaminophen (TYLENOL) 500 MG tablet Take 1,000 mg by mouth every 6 (six) hours as needed for moderate pain or fever.    [provider]  methylPREDNISolone (MEDROL DOSEPAK) 4 MG TBPK tablet tad 10/21/17   Eustace Moore, MD  metroNIDAZOLE (FLAGYL) 500 MG tablet Take 1 tablet (500 mg total) by mouth 2 (two) times daily. 12/28/16   Moss, Hospital doctor, DO  Multiple Vitamin (MULTIVITAMIN WITH MINERALS) TABS tablet Take 1 tablet by mouth daily.    [provider]  naproxen (NAPROSYN) 500 MG tablet Take 1 tablet (500 mg total) by mouth 2 (two) times daily. 10/18/17   Georgetta Haber, NP    Family History Family History  Problem Relation Age of Onset  . Hypertension Mother   . Sleep apnea Father   . Diabetes Maternal Grandmother   . Heart disease Maternal Grandmother   . Hypertension Maternal Grandmother   . Gout Maternal Grandmother   . Stroke Paternal Grandmother     Social History Social History   Tobacco Use  . Smoking status: Current Some Day Smoker    Types: Cigarettes  .  Smokeless tobacco: Never Used  Substance Use Topics  . Alcohol use: No  . Drug use: No     Allergies   Morphine and related   Review of Systems Review of Systems  Constitutional: Negative for chills, diaphoresis, fatigue and fever.  HENT: Negative for congestion, rhinorrhea and sore throat.   Respiratory: Negative for cough and shortness of breath.   Cardiovascular: Negative for chest pain.  Gastrointestinal: Negative for abdominal pain, nausea and vomiting.  Endocrine: Negative for cold intolerance and heat intolerance.  Genitourinary: Negative for dysuria.  Musculoskeletal: Negative for back pain and myalgias.  Skin: Negative for rash.  Allergic/Immunologic: Negative for immunocompromised state.  Neurological: Negative for weakness and headaches.  Hematological: Negative for adenopathy.     Physical Exam Updated Vital Signs  BP 131/73 (BP Location: Right Arm)   Pulse 98   Temp 98.4 F (36.9 C) (Oral)   Resp 15   SpO2 100%   Physical Exam Vitals signs and nursing note reviewed.  Constitutional:      General: She is not in acute distress.    Appearance: She is well-developed. She is not diaphoretic.  HENT:     Head: Normocephalic and atraumatic.     Nose: Nose normal. No congestion or rhinorrhea.     Mouth/Throat:     Mouth: Mucous membranes are moist.     Pharynx: No posterior oropharyngeal erythema.  Eyes:     Conjunctiva/sclera: Conjunctivae normal.  Neck:     Musculoskeletal: Normal range of motion and neck supple.  Cardiovascular:     Rate and Rhythm: Normal rate and regular rhythm.     Heart sounds: Normal heart sounds. No murmur. No friction rub. No gallop.   Pulmonary:     Effort: Pulmonary effort is normal. No respiratory distress.     Breath sounds: Normal breath sounds. No wheezing or rales.  Abdominal:     Palpations: Abdomen is soft.     Tenderness: There is no abdominal tenderness.  Musculoskeletal: Normal range of motion.  Skin:    General: Skin is warm.     Findings: No erythema or rash.  Neurological:     Mental Status: She is alert.    ED Treatments / Results  Labs (all labs ordered are listed, but only abnormal results are displayed) Labs Reviewed - No data to display  EKG None  Radiology No results found.  Procedures Procedures (including critical care time)  Medications Ordered in ED Medications - No data to display   Initial Impression / Assessment and Plan / ED Course  I have reviewed the triage vital signs and the nursing notes.  Pertinent labs & imaging results that were available during my care of the patient were reviewed by me and considered in my medical decision making (see chart for details).       Patient presents for concerns of a possible exposure to COVID-19. Discussed with patient that guidelines do not recommend testing asymptomatic  individuals. Patient had a possible exposure, but has been asymptomatic for 14 days. Will provide note for patient to return to work. Advised patient to follow up with PCP. Discussed return precautions with patient. Patient states she understands and agrees with plan.   Final Clinical Impressions(s) / ED Diagnoses   Final diagnoses:  Encounter to obtain excuse from work    ED Discharge Orders    None       Leretha DykesHernandez, Felicia Both P, [place]PA-C 06/17/18 1236    Tilden Fossaees, Elizabeth, MD 06/17/18 [phone]

## 2018-06-17 NOTE — ED Triage Notes (Signed)
Patient reports she was sent by her employer before going back to work since she self-quarantined for the past week. She denies any symptoms, but states could not go back to work without being cleared.

## 2018-06-17 NOTE — ED Notes (Signed)
Patient verbalized understanding of discharge instructions and denies any further needs or questions at this time. VS stable. Patient ambulatory with steady gait.  

## 2019-01-20 ENCOUNTER — Other Ambulatory Visit: Payer: Self-pay

## 2019-01-20 ENCOUNTER — Emergency Department
Admission: EM | Admit: 2019-01-20 | Discharge: 2019-01-20 | Discharge status: Against Medical Advice | Payer: Medicaid Other

## 2019-01-20 NOTE — ED Notes (Signed)
Pt notified staff that she was leaving 

## 2019-01-22 ENCOUNTER — Other Ambulatory Visit: Payer: Self-pay

## 2019-01-22 ENCOUNTER — Encounter (HOSPITAL_COMMUNITY): Payer: Self-pay | Admitting: Emergency Medicine

## 2019-01-22 ENCOUNTER — Emergency Department (HOSPITAL_COMMUNITY): Payer: Medicaid Other

## 2019-01-22 ENCOUNTER — Emergency Department (HOSPITAL_COMMUNITY)
Admission: EM | Admit: 2019-01-22 | Discharge: 2019-01-22 | Disposition: A | Discharge status: Home / Self Care | Payer: Medicaid Other | Attending: Emergency Medicine | Admitting: Emergency Medicine

## 2019-01-22 ENCOUNTER — Ambulatory Visit (HOSPITAL_COMMUNITY)
Admission: EM | Admit: 2019-01-22 | Discharge: 2019-01-22 | Discharge status: Against Medical Advice | Payer: Medicaid Other

## 2019-01-22 DIAGNOSIS — K219 Gastro-esophageal reflux disease without esophagitis: Secondary | ICD-10-CM | POA: Insufficient documentation

## 2019-01-22 DIAGNOSIS — Z79899 Other long term (current) drug therapy: Secondary | ICD-10-CM | POA: Insufficient documentation

## 2019-01-22 DIAGNOSIS — Z720 Tobacco use: Secondary | ICD-10-CM | POA: Insufficient documentation

## 2019-01-22 LAB — HEPATIC FUNCTION PANEL
ALT: 14 U/L (ref 0–44)
AST: 14 U/L — ABNORMAL LOW (ref 15–41)
Albumin: 3.6 g/dL (ref 3.5–5.0)
Alkaline Phosphatase: 47 U/L (ref 38–126)
Bilirubin, Direct: <0.1 mg/dL (ref 0.0–0.2)
Total Bilirubin: 0.3 mg/dL (ref 0.3–1.2)
Total Protein: 7.7 g/dL (ref 6.5–8.1)

## 2019-01-22 LAB — CBC
HCT: 35.6 % — ABNORMAL LOW (ref 36.0–46.0)
Hemoglobin: 10.5 g/dL — ABNORMAL LOW (ref 12.0–15.0)
MCH: 21.5 pg — ABNORMAL LOW (ref 26.0–34.0)
MCHC: 29.5 g/dL — ABNORMAL LOW (ref 30.0–36.0)
MCV: 73 fL — ABNORMAL LOW (ref 80.0–100.0)
Platelets: 396 10*3/uL (ref 150–400)
RBC: 4.88 MIL/uL (ref 3.87–5.11)
RDW: 15.9 % — ABNORMAL HIGH (ref 11.5–15.5)
WBC: 7.7 10*3/uL (ref 4.0–10.5)
nRBC: 0 % (ref 0.0–0.2)

## 2019-01-22 LAB — TROPONIN I (HIGH SENSITIVITY): Troponin I (High Sensitivity): <2 ng/L (ref <18)

## 2019-01-22 LAB — BASIC METABOLIC PANEL
Anion gap: 12 (ref 5–15)
BUN: 6 mg/dL (ref 6–20)
CO2: 25 mmol/L (ref 22–32)
Calcium: 9 mg/dL (ref 8.9–10.3)
Chloride: 102 mmol/L (ref 98–111)
Creatinine, Ser: 0.66 mg/dL (ref 0.44–1.00)
GFR calc Af Amer: >60 mL/min (ref >60)
GFR calc non Af Amer: >60 mL/min (ref >60)
Glucose, Bld: 96 mg/dL (ref 70–99)
Potassium: 3.9 mmol/L (ref 3.5–5.1)
Sodium: 139 mmol/L (ref 135–145)

## 2019-01-22 LAB — I-STAT BETA HCG BLOOD, ED (MC, WL, AP ONLY): I-stat hCG, quantitative: <5 m[IU]/mL (ref <5)

## 2019-01-22 MED ORDER — ALUM & MAG HYDROXIDE-SIMETH 200-200-20 MG/5ML PO SUSP
30.0000 mL | Freq: Once | ORAL | Status: AC
  Administered 2019-01-22: 30 mL via ORAL
  Filled 2019-01-22: qty 30

## 2019-01-22 MED ORDER — PANTOPRAZOLE SODIUM 20 MG PO TBEC: 20.0000 mg | DELAYED_RELEASE_TABLET | Freq: Every day | ORAL | 0 refills | Status: DC

## 2019-01-22 MED ORDER — SODIUM CHLORIDE 0.9% FLUSH: 3.0000 mL | Freq: Once | INTRAVENOUS | Status: DC

## 2019-01-22 NOTE — ED Triage Notes (Signed)
Pt states a few days ago she swallowed something and it feels lodged in her chest. Pt given our capacities here, pt also decided she wanted a heart attack work up, discussed with her that she can stay here and have a provider discuss with her if it was necessary for her to go, but pt opted to go straight over to the ER.

## 2019-01-22 NOTE — ED Triage Notes (Signed)
Pt reports pressure to center of chest and chest tightness after belching x 1 week.  States symptoms started after eating 2 burgers for lunch and then eating 2 more for dinner 1 week ago.  Felt like something was stuck in center of chest.  Denies nausea, vomiting, and diarrhea.

## 2019-01-22 NOTE — Discharge Instructions (Signed)
Recommend starting the medicine for the suspected gastric reflux.  I recommend follow-up with your primary doctor next week for recheck.  If you develop worsening chest pain, difficulty breathing or other new concerning symptom recommend return to ER for reassessment.

## 2019-01-22 NOTE — ED Provider Notes (Signed)
[facility] EMERGENCY DEPARTMENT Provider Note   CSN: [id number] Arrival date & time: 01/22/19  1335     History   Chief Complaint Chief Complaint  Patient presents with   chest pressure    HPI Victoria Rivers is a 43 y.o. female.  Presents emerge department chief complaint of chest discomfort.  Patient reports she has been having symptoms for the past week or so.  States that she has intermittent episodes of chest tightness, chest burning sensation.  States this becomes severe when she has any belching.  States she noticed this first after she ate 2 large burgers.  No other relieving or aggravating factors except for the belching.  Not associated with exertion.  She has no cough, no difficulty breathing.  No abdominal pain.     HPI  Past Medical History:  Diagnosis Date   Anxiety    no meds   BV (bacterial vaginosis)    Chlamydia    Gonorrhea    Pregnancy, ectopic     Patient Active Problem List   Diagnosis Date Noted   Acute viral bronchitis 05/05/2014   Sinus tachycardia 05/05/2014   Lactic acidosis 05/05/2014   Nausea and vomiting 05/05/2014   Hypokalemia 05/05/2014   Influenza with respiratory manifestations     Past Surgical History:  Procedure Laterality Date   DILATION AND CURETTAGE OF UTERUS     ECTOPIC PREGNANCY SURGERY     pt denies, states only received MTX   THERAPEUTIC ABORTION       OB History    Gravida  7   Para  4   Term  4   Preterm      AB  3   Living  4     SAB      TAB  2   Ectopic  1   Multiple      Live Births  4        Obstetric Comments  MTX for ectopic         Home Medications    Prior to Admission medications   Medication Sig Start Date End Date Taking? Authorizing Provider  acetaminophen (TYLENOL) 325 MG tablet Take 650 mg by mouth every 6 (six) hours as needed for moderate pain or fever.    [provider]  acetaminophen (TYLENOL) 500 MG tablet Take 1,000 mg  by mouth every 6 (six) hours as needed for moderate pain or fever.    [provider]  methylPREDNISolone (MEDROL DOSEPAK) 4 MG TBPK tablet tad 10/21/17   Raylene Everts, MD  metroNIDAZOLE (FLAGYL) 500 MG tablet Take 1 tablet (500 mg total) by mouth 2 (two) times daily. 12/28/16   Moss, Museum/gallery conservator, DO  Multiple Vitamin (MULTIVITAMIN WITH MINERALS) TABS tablet Take 1 tablet by mouth daily.    [provider]  naproxen (NAPROSYN) 500 MG tablet Take 1 tablet (500 mg total) by mouth 2 (two) times daily. 10/18/17   Zigmund Gottron, NP  pantoprazole (PROTONIX) 20 MG tablet Take 1 tablet (20 mg total) by mouth daily. 01/22/19   [name], MD    Family History Family History  Problem Relation Age of Onset   Hypertension Mother    Sleep apnea Father    Diabetes Maternal Grandmother    Heart disease Maternal Grandmother    Hypertension Maternal Grandmother    Gout Maternal Grandmother    Stroke Paternal Grandmother     Social History Social History   Tobacco Use  Smoking status: Current Some Day Smoker    Types: Cigarettes   Smokeless tobacco: Never Used  Substance Use Topics   Alcohol use: No   Drug use: No     Allergies   Morphine and related   Review of Systems Review of Systems  Constitutional: Negative for chills and fever.  HENT: Negative for ear pain and sore throat.   Eyes: Negative for pain and visual disturbance.  Respiratory: Negative for cough and shortness of breath.   Cardiovascular: Positive for chest pain. Negative for palpitations.  Gastrointestinal: Negative for abdominal pain and vomiting.  Genitourinary: Negative for dysuria and hematuria.  Musculoskeletal: Negative for arthralgias and back pain.  Skin: Negative for color change and rash.  Neurological: Negative for seizures and syncope.  All other systems reviewed and are negative.    Physical Exam Updated Vital Signs BP (!) 173/85 (BP Location: Left Arm)    Pulse  87    Temp 98.8 F (37.1 C) (Oral)    Resp 14    SpO2 97%   Physical Exam Vitals signs and nursing note reviewed.  Constitutional:      General: She is not in acute distress.    Appearance: She is well-developed.  HENT:     Head: Normocephalic and atraumatic.  Eyes:     Conjunctiva/sclera: Conjunctivae normal.  Neck:     Musculoskeletal: Neck supple.  Cardiovascular:     Rate and Rhythm: Normal rate and regular rhythm.     Heart sounds: No murmur.  Pulmonary:     Effort: Pulmonary effort is normal. No respiratory distress.     Breath sounds: Normal breath sounds.  Abdominal:     Palpations: Abdomen is soft.     Tenderness: There is no abdominal tenderness.  Skin:    General: Skin is warm and dry.     Capillary Refill: Capillary refill takes less than 2 seconds.  Neurological:     General: No focal deficit present.     Mental Status: She is alert and oriented to person, place, and time.  Psychiatric:        Mood and Affect: Mood normal.        Behavior: Behavior normal.      ED Treatments / Results  Labs (all labs ordered are listed, but only abnormal results are displayed) Labs Reviewed  CBC - Abnormal; Notable for the following components:      Result Value   Hemoglobin 10.5 (*)    HCT 35.6 (*)    MCV 73.0 (*)    MCH 21.5 (*)    MCHC 29.5 (*)    RDW 15.9 (*)    All other components within normal limits  HEPATIC FUNCTION PANEL - Abnormal; Notable for the following components:   AST 14 (*)    All other components within normal limits  BASIC METABOLIC PANEL  I-STAT BETA HCG BLOOD, ED ([facility], [facility], [facility] ONLY)  TROPONIN I (HIGH SENSITIVITY)  TROPONIN I (HIGH SENSITIVITY)    EKG EKG Interpretation  Date/Time:  Sunday January 22 2019 14:03:55 EST Ventricular Rate:  88 PR Interval:  160 QRS Duration: 78 QT Interval:  366 QTC Calculation: 442 R Axis:   -5 Text Interpretation: Normal sinus rhythm Normal ECG Confirmed by Marianna Fuss ([id number]) on 01/22/2019  2:19:57 PM   Radiology Dg Chest 2 View  Result Date: 01/22/2019 CLINICAL DATA:  Chest pain EXAM: CHEST - 2 VIEW COMPARISON:  05/04/2014 FINDINGS: The heart size and mediastinal contours are within normal  limits. Both lungs are clear. The visualized skeletal structures are unremarkable. IMPRESSION: No acute abnormality of the lungs. Electronically Signed   By: Lauralyn PrimesAlex  Bibbey M.D.   On: 01/22/2019 14:47    Procedures Procedures (including critical care time)  Medications Ordered in ED Medications  sodium chloride flush (NS) 0.9 % injection 3 mL (has no administration in time range)  alum & mag hydroxide-simeth (MAALOX/MYLANTA) 200-200-20 MG/5ML suspension 30 mL (30 mLs Oral Given 01/22/19 1503)     Initial Impression / Assessment and Plan / ED Course  I have reviewed the triage vital signs and the nursing notes.  Pertinent labs & imaging results that were available during my care of the patient were reviewed by me and considered in my medical decision making (see chart for details).       43 year old lady who presents to the ER with chest tightness.  On exam she is well-appearing, no ongoing symptoms at time of my interview.  She has normal EKG, I doubt ACS or acute cardiac process.  Labs within normal limits.  Based on symptomatology, suspect GERD.  Will start PPI.      After the discussed management above, the patient was determined to be safe for discharge.  The patient was in agreement with this plan and all questions regarding their care were answered.  ED return precautions were discussed and the patient will return to the ED with any significant worsening of condition.  Final Clinical Impressions(s) / ED Diagnoses   Final diagnoses:  Gastric reflux    ED Discharge Orders         Ordered    pantoprazole (PROTONIX) 20 MG tablet  Daily     01/22/19 1517           Milagros Lollykstra, Jessicah Croll S, MD 01/22/19 1526

## 2019-03-08 ENCOUNTER — Other Ambulatory Visit: Payer: Medicaid Other

## 2019-03-09 ENCOUNTER — Ambulatory Visit: Payer: Medicaid Other | Attending: Internal Medicine

## 2019-03-09 DIAGNOSIS — Z20822 Contact with and (suspected) exposure to covid-19: Secondary | ICD-10-CM

## 2019-03-11 ENCOUNTER — Telehealth: Payer: Self-pay

## 2019-03-11 NOTE — Telephone Encounter (Signed)
Pt requesting link to be sent to her via email to get signed up for MyChart. Pt informed that her results for covid are not back. Link sent to pt and she did receive it.

## 2019-03-15 ENCOUNTER — Ambulatory Visit: Payer: Medicaid Other | Attending: Internal Medicine

## 2019-03-15 ENCOUNTER — Telehealth: Payer: Self-pay

## 2019-03-15 DIAGNOSIS — Z20822 Contact with and (suspected) exposure to covid-19: Secondary | ICD-10-CM

## 2019-03-15 LAB — NOVEL CORONAVIRUS, NAA

## 2019-03-15 NOTE — Telephone Encounter (Signed)
Pt called to get covid test results from 12/31 @ [facility]. Explained that she would need to be re-tested.   Transferred call to nurse and requested a letter with retesting information to pts mychart.   Victoria Rivers

## 2019-03-15 NOTE — Telephone Encounter (Signed)
Scheduled pt. For retesting appt. Today at 9:00 AM at [facility] Testing site.  Also advised will release letter to MyChart that explains need for retesting.  Pt. Verb. Understanding.

## 2019-03-17 LAB — NOVEL CORONAVIRUS, NAA: SARS-CoV-2, NAA: NOT DETECTED

## 2019-06-25 ENCOUNTER — Encounter (HOSPITAL_COMMUNITY): Payer: Self-pay

## 2019-06-25 ENCOUNTER — Ambulatory Visit (HOSPITAL_COMMUNITY)
Admission: EM | Admit: 2019-06-25 | Discharge: 2019-06-25 | Disposition: A | Discharge status: Home / Self Care | Payer: Commercial Managed Care - PPO | Attending: Family Medicine | Admitting: Family Medicine

## 2019-06-25 ENCOUNTER — Other Ambulatory Visit: Payer: Self-pay

## 2019-06-25 DIAGNOSIS — S29012A Strain of muscle and tendon of back wall of thorax, initial encounter: Secondary | ICD-10-CM | POA: Diagnosis not present

## 2019-06-25 MED ORDER — CYCLOBENZAPRINE HCL 5 MG PO TABS: 5.0000 mg | ORAL_TABLET | Freq: Every day | ORAL | 0 refills | Status: DC

## 2019-06-25 MED ORDER — HYDROCODONE-ACETAMINOPHEN 5-325 MG PO TABS: 1.0000 | ORAL_TABLET | Freq: Four times a day (QID) | ORAL | 0 refills | Status: DC | PRN

## 2019-06-25 MED ORDER — DICLOFENAC SODIUM 75 MG PO TBEC: 75.0000 mg | DELAYED_RELEASE_TABLET | Freq: Two times a day (BID) | ORAL | 0 refills | Status: DC

## 2019-06-25 NOTE — ED Provider Notes (Signed)
[facility]    CSN: [id number] Arrival date & time: 06/25/19  1525      History   Chief Complaint Chief Complaint  Patient presents with  . Neck Pain  . Shoulder Pain    HPI Victoria Rivers is a 44 y.o. female.   44 yo established [place] patient  Pt present right side neck and shoulder pain. Symptoms started on Wednesday. Pt states she cannot apply pressure or lay on the side because of the pain.  No shortness of breath, cough or fever.  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     Past Medical History:  Diagnosis Date  . Anxiety    no meds  . BV (bacterial vaginosis)   . Chlamydia   . Gonorrhea   . Pregnancy, ectopic     Patient Active Problem List   Diagnosis Date Noted  . Acute viral bronchitis 05/05/2014  . Sinus tachycardia 05/05/2014  . Lactic acidosis 05/05/2014  . Nausea and vomiting 05/05/2014  . Hypokalemia 05/05/2014  . Influenza with respiratory manifestations     Past Surgical History:  Procedure Laterality Date  . DILATION AND CURETTAGE OF UTERUS    . ECTOPIC PREGNANCY SURGERY     pt denies, states only received MTX  . THERAPEUTIC ABORTION      OB History    Gravida  7   Para  4   Term  4   Preterm      AB  3   Living  4     SAB      TAB  2   Ectopic  1   Multiple      Live Births  4        Obstetric Comments  MTX for ectopic         Home Medications    Prior to Admission medications   Medication Sig Start Date End Date Taking? Authorizing Provider  acetaminophen (TYLENOL) 325 MG tablet Take 650 mg by mouth every 6 (six) hours as needed for moderate pain or fever.    [provider]  acetaminophen (TYLENOL) 500 MG tablet Take 1,000 mg by mouth every 6 (six) hours as needed for moderate pain or fever.    [provider]  cyclobenzaprine (FLEXERIL) 5  MG tablet Take 1 tablet (5 mg total) by mouth at bedtime. 06/25/19   [name], MD  diclofenac (VOLTAREN) 75 MG EC tablet Take 1 tablet (75 mg total) by mouth 2 (two) times daily. 06/25/19   [name], MD  HYDROcodone-acetaminophen (NORCO) 5-325 MG tablet Take 1 tablet by mouth every 6 (six) hours as needed for moderate pain. 06/25/19   [name], MD  Multiple Vitamin (MULTIVITAMIN WITH MINERALS) TABS tablet Take 1 tablet by mouth daily.    [provider]  pantoprazole (PROTONIX) 20 MG tablet Take 1 tablet (20 mg total) by mouth daily. 01/22/19   [name], MD    Family History Family History  Problem Relation Age of Onset  . Hypertension Mother   . Sleep apnea Father   . Diabetes Maternal Grandmother   . Heart disease Maternal Grandmother   . Hypertension Maternal Grandmother   . Gout Maternal Grandmother   . Stroke Paternal Grandmother     Social History Social History   Tobacco Use  . Smoking status: Current Some Day  Smoker    Types: Cigarettes  . Smokeless tobacco: Never Used  Substance Use Topics  . Alcohol use: No  . Drug use: No     Allergies   Morphine and related   Review of Systems Review of Systems  Musculoskeletal: Positive for back pain.  All other systems reviewed and are negative.    Physical Exam Triage Vital Signs ED Triage Vitals  Enc Vitals Group     BP 06/25/19 1617 122/77     Pulse Rate 06/25/19 1617 93     Resp 06/25/19 1617 18     Temp 06/25/19 1617 98 F (36.7 C)     Temp Source 06/25/19 1617 Oral     SpO2 06/25/19 1617 100 %     Weight --      Height --      Head Circumference --      Peak Flow --      Pain Score 06/25/19 1615 8     Pain Loc --      Pain Edu? --      Excl. in [place]? --    No data found.  Updated Vital Signs BP 122/77 (BP Location: Left Arm)   Pulse 93   Temp 98 F (36.7 C) (Oral)   Resp 18   LMP 06/12/2019   SpO2 100%     Physical Exam Vitals and nursing note  reviewed.  Constitutional:      Appearance: Normal appearance. She is obese.  HENT:     Head: Normocephalic.  Eyes:     Conjunctiva/sclera: Conjunctivae normal.  Cardiovascular:     Rate and Rhythm: Normal rate.     Pulses: Normal pulses.  Pulmonary:     Effort: Pulmonary effort is normal.  Musculoskeletal:        General: Tenderness present. No swelling, deformity or signs of injury.     Cervical back: Normal range of motion.     Comments: Tender medial and superior borders of right scapula with normal neck and arm ROM  Skin:    General: Skin is warm and dry.  Neurological:     General: No focal deficit present.     Mental Status: She is alert.  Psychiatric:        Mood and Affect: Mood normal.        Behavior: Behavior normal.        Thought Content: Thought content normal.        Judgment: Judgment normal.      UC Treatments / Results    Initial Impression / Assessment and Plan / UC Course  I have reviewed the triage vital signs and the nursing notes.  Pertinent labs & imaging results that were available during my care of the patient were reviewed by me and considered in my medical decision making (see chart for details).    Final Clinical Impressions(s) / UC Diagnoses   Final diagnoses:  Strain of thoracic back region   Discharge Instructions   None    ED Prescriptions    Medication Sig Dispense Auth. Provider   HYDROcodone-acetaminophen (NORCO) 5-325 MG tablet Take 1 tablet by mouth every 6 (six) hours as needed for moderate pain. 12 tablet Elvina Sidle, MD   diclofenac (VOLTAREN) 75 MG EC tablet Take 1 tablet (75 mg total) by mouth 2 (two) times daily. 14 tablet Elvina Sidle, MD   cyclobenzaprine (FLEXERIL) 5 MG tablet Take 1 tablet (5 mg total) by mouth at bedtime. 7 tablet Elvina Sidle,  MD     I have reviewed the PDMP during this encounter.   Elvina Sidle, MD 06/25/19 [phone]

## 2019-06-25 NOTE — ED Triage Notes (Signed)
Pt present right side neck and shoulder pain. Symptoms started on Wednesday. Pt states she cannot apply pressure or lay on the side because of the pain.

## 2019-06-28 ENCOUNTER — Other Ambulatory Visit: Payer: Self-pay

## 2019-06-28 ENCOUNTER — Encounter (HOSPITAL_COMMUNITY): Payer: Self-pay

## 2019-06-28 ENCOUNTER — Ambulatory Visit (HOSPITAL_COMMUNITY)
Admission: EM | Admit: 2019-06-28 | Discharge: 2019-06-28 | Disposition: A | Discharge status: Home / Self Care | Payer: Commercial Managed Care - PPO

## 2019-06-28 DIAGNOSIS — M542 Cervicalgia: Secondary | ICD-10-CM

## 2019-06-28 DIAGNOSIS — M62838 Other muscle spasm: Secondary | ICD-10-CM

## 2019-06-28 DIAGNOSIS — M25511 Pain in right shoulder: Secondary | ICD-10-CM

## 2019-06-28 NOTE — ED Triage Notes (Signed)
Pt c/o right shoulder/neck pain onset last week. Pt was seen here on Sunday for same and states pain is a bit worse; she feels like she needs more time off from work and time for muscle relaxants/nsaids to work.

## 2019-06-28 NOTE — Discharge Instructions (Signed)
Take the medications as prescribed.  Apply ice packs 2-3 times a day for up to 20 minutes each.    Follow up with your primary care provider or an orthopedist if you symptoms continue or worsen;  Or if you develop new symptoms, such as numbness, tingling, or weakness.    Orthopedics information is provided above.

## 2019-07-01 NOTE — ED Provider Notes (Signed)
[facility]    CSN: [id number] Arrival date & time: 06/28/19  1227      History   Chief Complaint Chief Complaint  Patient presents with  . Shoulder Pain  . Neck Pain    HPI Victoria Rivers is a 44 y.o. female.   Patient reports that she is having right shoulder and neck pain.  Reports that the onset is been about a week ago.  She was seen here on Sunday by Dr. Milus Glazier for the same.  Reports that pain is just a bit worse, she has been up and trying to be more active than usual and feels that she has aggravated herself.  Denies needing refills on medications at this time.  Is taking ibuprofen and Flexeril.  Reports that she feels that this is helping, but that the swelling is not down enough for her to return to work.  The history is provided by the patient.  Shoulder Pain Associated symptoms: neck pain   Neck Pain   Past Medical History:  Diagnosis Date  . Anxiety    no meds  . BV (bacterial vaginosis)   . Chlamydia   . Gonorrhea   . Pregnancy, ectopic     Patient Active Problem List   Diagnosis Date Noted  . Acute viral bronchitis 05/05/2014  . Sinus tachycardia 05/05/2014  . Lactic acidosis 05/05/2014  . Nausea and vomiting 05/05/2014  . Hypokalemia 05/05/2014  . Influenza with respiratory manifestations     Past Surgical History:  Procedure Laterality Date  . DILATION AND CURETTAGE OF UTERUS    . ECTOPIC PREGNANCY SURGERY     pt denies, states only received MTX  . THERAPEUTIC ABORTION      OB History    Gravida  7   Para  4   Term  4   Preterm      AB  3   Living  4     SAB      TAB  2   Ectopic  1   Multiple      Live Births  4        Obstetric Comments  MTX for ectopic         Home Medications    Prior to Admission medications   Medication Sig Start Date End Date Taking? Authorizing Provider  cyclobenzaprine (FLEXERIL) 5 MG tablet Take 1 tablet (5 mg total) by mouth at bedtime. 06/25/19  Yes Elvina Sidle,  MD  diclofenac (VOLTAREN) 75 MG EC tablet Take 1 tablet (75 mg total) by mouth 2 (two) times daily. 06/25/19  Yes Elvina Sidle, MD  HYDROcodone-acetaminophen (NORCO) 5-325 MG tablet Take 1 tablet by mouth every 6 (six) hours as needed for moderate pain. 06/25/19  Yes Elvina Sidle, MD  acetaminophen (TYLENOL) 325 MG tablet Take 650 mg by mouth every 6 (six) hours as needed for moderate pain or fever.    [provider]  acetaminophen (TYLENOL) 500 MG tablet Take 1,000 mg by mouth every 6 (six) hours as needed for moderate pain or fever.    [provider]  Multiple Vitamin (MULTIVITAMIN WITH MINERALS) TABS tablet Take 1 tablet by mouth daily.    [provider]  pantoprazole (PROTONIX) 20 MG tablet Take 1 tablet (20 mg total) by mouth daily. 01/22/19   Milagros Loll, MD    Family History Family History  Problem Relation Age of Onset  . Hypertension Mother   . Sleep apnea Father   . Diabetes  Maternal Grandmother   . Heart disease Maternal Grandmother   . Hypertension Maternal Grandmother   . Gout Maternal Grandmother   . Stroke Paternal Grandmother     Social History Social History   Tobacco Use  . Smoking status: Current Some Day Smoker    Types: Cigarettes  . Smokeless tobacco: Never Used  Substance Use Topics  . Alcohol use: No  . Drug use: No     Allergies   Morphine and related   Review of Systems Review of Systems  Musculoskeletal: Positive for neck pain.     Physical Exam Triage Vital Signs ED Triage Vitals  Enc Vitals Group     BP 06/28/19 1245 (!) 140/92     Pulse Rate 06/28/19 1245 93     Resp 06/28/19 1245 18     Temp 06/28/19 1245 97.7 F (36.5 C)     Temp Source 06/28/19 1245 Oral     SpO2 06/28/19 1245 100 %     Weight --      Height --      Head Circumference --      Peak Flow --      Pain Score 06/28/19 1242 8     Pain Loc --      Pain Edu? --      Excl. in [place]? --    No data found.  Updated Vital  Signs BP (!) 140/92 (BP Location: Left Wrist)   Pulse 93   Temp 97.7 F (36.5 C) (Oral)   Resp 18   LMP 06/12/2019   SpO2 100%   Visual Acuity Right Eye Distance:   Left Eye Distance:   Bilateral Distance:    Right Eye Near:   Left Eye Near:    Bilateral Near:     Physical Exam Vitals and nursing note reviewed.  Constitutional:      General: She is not in acute distress.    Appearance: Normal appearance. She is well-developed. She is obese. She is not ill-appearing.  HENT:     Head: Normocephalic and atraumatic.  Eyes:     Extraocular Movements: Extraocular movements intact.     Conjunctiva/sclera: Conjunctivae normal.     Pupils: Pupils are equal, round, and reactive to light.  Cardiovascular:     Rate and Rhythm: Normal rate and regular rhythm.     Heart sounds: Normal heart sounds. No murmur.  Pulmonary:     Effort: Pulmonary effort is normal. No respiratory distress.     Breath sounds: Normal breath sounds. No stridor. No wheezing, rhonchi or rales.  Chest:     Chest wall: No tenderness.  Abdominal:     General: Bowel sounds are normal.     Palpations: Abdomen is soft.     Tenderness: There is no abdominal tenderness.  Musculoskeletal:        General: Swelling and tenderness present.     Cervical back: Normal range of motion and neck supple.     Comments: Mild swelling and tenderness to palpation of right trapezius.  Muscle is in spasm on palpation.  Skin:    General: Skin is warm and dry.     Capillary Refill: Capillary refill takes less than 2 seconds.  Neurological:     General: No focal deficit present.     Mental Status: She is alert and oriented to person, place, and time.  Psychiatric:        Mood and Affect: Mood normal.  Behavior: Behavior normal.      UC Treatments / Results  Labs (all labs ordered are listed, but only abnormal results are displayed) Labs Reviewed - No data to display  EKG   Radiology No results  found.  Procedures Procedures (including critical care time)  Medications Ordered in UC Medications - No data to display  Initial Impression / Assessment and Plan / UC Course  I have reviewed the triage vital signs and the nursing notes.  Pertinent labs & imaging results that were available during my care of the patient were reviewed by me and considered in my medical decision making (see chart for details).     Neck pain: Presents with right-sided neck and shoulder pain over the last week.  Was seen here on Sunday and given prescription NSAIDs and muscle relaxers.  Right trapezius tender and in spasm on palpation.  No limited range of motion.  Instructed patient to continue NSAIDs, muscle relaxants.  She can use topical rubs as well as heat.  Instructed patient to follow-up with primary care this office as needed.  Instructed patient to follow-up the ER for trouble swallowing, trouble breathing, loss of sensation in extremities, loss of bowel or bladder control, other concerning symptoms.  Provided work note this visit.  Patient verbalized understanding and is in agreement with treatment plan. Final Clinical Impressions(s) / UC Diagnoses   Final diagnoses:  Right shoulder pain, unspecified chronicity  Muscle spasm  Neck pain     Discharge Instructions     Take the medications as prescribed.  Apply ice packs 2-3 times a day for up to 20 minutes each.    Follow up with your primary care provider or an orthopedist if you symptoms continue or worsen;  Or if you develop new symptoms, such as numbness, tingling, or weakness.    Orthopedics information is provided above.    ED Prescriptions    None     PDMP not reviewed this encounter.   Moshe Cipro, NP 07/01/19 1824

## 2020-01-23 ENCOUNTER — Ambulatory Visit (HOSPITAL_COMMUNITY)
Admission: EM | Admit: 2020-01-23 | Discharge: 2020-01-23 | Disposition: A | Discharge status: Home / Self Care | Payer: Commercial Managed Care - PPO | Attending: Family Medicine | Admitting: Family Medicine

## 2020-01-23 ENCOUNTER — Other Ambulatory Visit: Payer: Self-pay

## 2020-01-23 ENCOUNTER — Encounter (HOSPITAL_COMMUNITY): Payer: Self-pay | Admitting: Emergency Medicine

## 2020-01-23 DIAGNOSIS — Z885 Allergy status to narcotic agent status: Secondary | ICD-10-CM | POA: Diagnosis not present

## 2020-01-23 DIAGNOSIS — R059 Cough, unspecified: Secondary | ICD-10-CM | POA: Diagnosis not present

## 2020-01-23 DIAGNOSIS — Z20822 Contact with and (suspected) exposure to covid-19: Secondary | ICD-10-CM | POA: Diagnosis not present

## 2020-01-23 DIAGNOSIS — F17210 Nicotine dependence, cigarettes, uncomplicated: Secondary | ICD-10-CM | POA: Diagnosis not present

## 2020-01-23 DIAGNOSIS — J069 Acute upper respiratory infection, unspecified: Secondary | ICD-10-CM | POA: Diagnosis not present

## 2020-01-23 DIAGNOSIS — Z791 Long term (current) use of non-steroidal anti-inflammatories (NSAID): Secondary | ICD-10-CM | POA: Insufficient documentation

## 2020-01-23 DIAGNOSIS — Z79899 Other long term (current) drug therapy: Secondary | ICD-10-CM | POA: Insufficient documentation

## 2020-01-23 NOTE — ED Triage Notes (Signed)
Pt presents with productive cough, nasal congestion, headaches, body aches and fever xs 5 days. States took rapid test on 11/12

## 2020-01-23 NOTE — ED Provider Notes (Signed)
[facility]    CSN: [id number] Arrival date & time: 01/23/20  1136      History   Chief Complaint Chief Complaint  Patient presents with  . Cough  . Nasal Congestion    HPI Victoria Rivers is a 44 y.o. female.   Presenting today with about 5 days of cough, nasal congestion, sore throat, fatigue, body aches that she states is improving the past day or so. She denies fever, chills, CP, SOB, abdominal pain, N/V/D. Had initial COVID test done day of onset of sxs which was negative, requesting re-test today to see if she can go back to work. Taking numerous OTC remedies such as robitussin, dayquil and nyquil, mucinex, and hot teas. No known sick contacts. UTD on vaccines. Hx of bronchitis, no other pertinent chronic medical conditions.      Past Medical History:  Diagnosis Date  . Anxiety    no meds  . BV (bacterial vaginosis)   . Chlamydia   . Gonorrhea   . Pregnancy, ectopic     Patient Active Problem List   Diagnosis Date Noted  . Acute viral bronchitis 05/05/2014  . Sinus tachycardia 05/05/2014  . Lactic acidosis 05/05/2014  . Nausea and vomiting 05/05/2014  . Hypokalemia 05/05/2014  . Influenza with respiratory manifestations     Past Surgical History:  Procedure Laterality Date  . DILATION AND CURETTAGE OF UTERUS    . ECTOPIC PREGNANCY SURGERY     pt denies, states only received MTX  . THERAPEUTIC ABORTION      OB History    Gravida  7   Para  4   Term  4   Preterm      AB  3   Living  4     SAB      TAB  2   Ectopic  1   Multiple      Live Births  4        Obstetric Comments  MTX for ectopic         Home Medications    Prior to Admission medications   Medication Sig Start Date End Date Taking? Authorizing Provider  acetaminophen (TYLENOL) 325 MG tablet Take 650 mg by mouth every 6 (six) hours as needed for moderate pain or fever.    [provider]  acetaminophen (TYLENOL) 500 MG tablet Take 1,000 mg by  mouth every 6 (six) hours as needed for moderate pain or fever.    [provider]  cyclobenzaprine (FLEXERIL) 5 MG tablet Take 1 tablet (5 mg total) by mouth at bedtime. 06/25/19   Elvina Sidle, MD  diclofenac (VOLTAREN) 75 MG EC tablet Take 1 tablet (75 mg total) by mouth 2 (two) times daily. 06/25/19   Elvina Sidle, MD  HYDROcodone-acetaminophen (NORCO) 5-325 MG tablet Take 1 tablet by mouth every 6 (six) hours as needed for moderate pain. 06/25/19   Elvina Sidle, MD  Multiple Vitamin (MULTIVITAMIN WITH MINERALS) TABS tablet Take 1 tablet by mouth daily.    [provider]  pantoprazole (PROTONIX) 20 MG tablet Take 1 tablet (20 mg total) by mouth daily. 01/22/19   Milagros Loll, MD    Family History Family History  Problem Relation Age of Onset  . Hypertension Mother   . Sleep apnea Father   . Diabetes Maternal Grandmother   . Heart disease Maternal Grandmother   . Hypertension Maternal Grandmother   . Gout Maternal Grandmother   . Stroke Paternal Grandmother  Social History Social History   Tobacco Use  . Smoking status: Current Some Day Smoker    Types: Cigarettes  . Smokeless tobacco: Never Used  Substance Use Topics  . Alcohol use: No  . Drug use: No     Allergies   Morphine and related   Review of Systems Review of Systems PER HPI   Physical Exam Triage Vital Signs ED Triage Vitals  Enc Vitals Group     BP 01/23/20 1216 (!) 131/99     Pulse Rate 01/23/20 1216 96     Resp 01/23/20 1216 19     Temp 01/23/20 1216 98.4 F (36.9 C)     Temp Source 01/23/20 1216 Oral     SpO2 01/23/20 1216 98 %     Weight --      Height --      Head Circumference --      Peak Flow --      Pain Score 01/23/20 1213 0     Pain Loc --      Pain Edu? --      Excl. in GC? --    No data found.  Updated Vital Signs BP (!) 131/99 (BP Location: Left Arm)   Pulse 96   Temp 98.4 F (36.9 C) (Oral)   Resp 19   LMP 01/17/2020   SpO2 98%    Visual Acuity Right Eye Distance:   Left Eye Distance:   Bilateral Distance:    Right Eye Near:   Left Eye Near:    Bilateral Near:     Physical Exam Vitals and nursing note reviewed.  Constitutional:      Appearance: Normal appearance. She is not ill-appearing.  HENT:     Head: Atraumatic.     Right Ear: Tympanic membrane normal.     Left Ear: Tympanic membrane normal.     Nose:     Comments: Nasal mucosa erythematous and edematous    Mouth/Throat:     Mouth: Mucous membranes are moist.     Pharynx: Posterior oropharyngeal erythema present.  Eyes:     Extraocular Movements: Extraocular movements intact.     Conjunctiva/sclera: Conjunctivae normal.  Cardiovascular:     Rate and Rhythm: Normal rate and regular rhythm.     Heart sounds: Normal heart sounds.  Pulmonary:     Effort: Pulmonary effort is normal.     Breath sounds: Normal breath sounds. No wheezing or rales.  Abdominal:     General: Bowel sounds are normal.     Palpations: Abdomen is soft.  Musculoskeletal:        General: Normal range of motion.     Cervical back: Normal range of motion and neck supple.  Skin:    General: Skin is warm and dry.  Neurological:     Mental Status: She is alert and oriented to person, place, and time.  Psychiatric:        Mood and Affect: Mood normal.        Thought Content: Thought content normal.        Judgment: Judgment normal.      UC Treatments / Results  Labs (all labs ordered are listed, but only abnormal results are displayed) Labs Reviewed  SARS CORONAVIRUS 2 (TAT 6-24 HRS)    EKG   Radiology No results found.  Procedures Procedures (including critical care time)  Medications Ordered in UC Medications - No data to display  Initial Impression / Assessment and Plan / UC Course  I have reviewed the triage vital signs and the nursing notes.  Pertinent labs & imaging results that were available during my care of the patient were reviewed by me and  considered in my medical decision making (see chart for details).     COVID pcr pending, sxs improving per patient report. Isolate until results return, new work note given. COntinue OTC remedies and supportive care. Return if sxs begin to worsen.   Final Clinical Impressions(s) / UC Diagnoses   Final diagnoses:  Viral URI with cough   Discharge Instructions   None    ED Prescriptions    None     PDMP not reviewed this encounter.   Particia Nearing, [place] 01/23/20 1420

## 2020-01-24 LAB — SARS CORONAVIRUS 2 (TAT 6-24 HRS): SARS Coronavirus 2: NEGATIVE

## 2020-05-27 ENCOUNTER — Ambulatory Visit (INDEPENDENT_AMBULATORY_CARE_PROVIDER_SITE_OTHER): Payer: Commercial Managed Care - PPO

## 2020-05-27 ENCOUNTER — Other Ambulatory Visit: Payer: Self-pay

## 2020-05-27 ENCOUNTER — Ambulatory Visit (HOSPITAL_COMMUNITY)
Admission: EM | Admit: 2020-05-27 | Discharge: 2020-05-27 | Disposition: A | Discharge status: Home / Self Care | Payer: Commercial Managed Care - PPO

## 2020-05-27 ENCOUNTER — Encounter (HOSPITAL_COMMUNITY): Payer: Self-pay

## 2020-05-27 DIAGNOSIS — M25521 Pain in right elbow: Secondary | ICD-10-CM

## 2020-05-27 DIAGNOSIS — M79601 Pain in right arm: Secondary | ICD-10-CM | POA: Diagnosis not present

## 2020-05-27 HISTORY — DX: Essential (primary) hypertension: I10

## 2020-05-27 NOTE — ED Provider Notes (Addendum)
[facility]    CSN: [id number] Arrival date & time: 05/27/20  1433      History   Chief Complaint Chief Complaint  Patient presents with  . Arm Pain    HPI Victoria Rivers is a 45 y.o. female.   Victoria Rivers is here with chief complaint of right elbow and arm pain. Reports pain as intermittent and shooting.  Reports being evaluated by PCP and was told it was most likely arthritis.  Reports pain radiates around elbow.  Reports concern for cancer or DVT.  Denies any redness or swelling.  Denies any specific alleviating or aggravating factors.  Denies any fevers, chest pain, shortness of breath, N/V/D, abdominal pain, or headaches.   ROS: As per HPI, all other pertinent ROS negative   The history is provided by the patient.  Arm Pain    Past Medical History:  Diagnosis Date  . Anxiety    no meds  . BV (bacterial vaginosis)   . Chlamydia   . Gonorrhea   . Hypertension   . Pregnancy, ectopic     Patient Active Problem List   Diagnosis Date Noted  . Acute viral bronchitis 05/05/2014  . Sinus tachycardia 05/05/2014  . Lactic acidosis 05/05/2014  . Nausea and vomiting 05/05/2014  . Hypokalemia 05/05/2014  . Influenza with respiratory manifestations     Past Surgical History:  Procedure Laterality Date  . DILATION AND CURETTAGE OF UTERUS    . ECTOPIC PREGNANCY SURGERY     pt denies, states only received MTX  . THERAPEUTIC ABORTION      OB History    Gravida  7   Para  4   Term  4   Preterm      AB  3   Living  4     SAB      IAB  2   Ectopic  1   Multiple      Live Births  4        Obstetric Comments  MTX for ectopic         Home Medications    Prior to Admission medications   Medication Sig Start Date End Date Taking? Authorizing Provider  acetaminophen (TYLENOL) 325 MG tablet Take 650 mg by mouth every 6 (six) hours as needed for moderate pain or fever.    [provider]  acetaminophen (TYLENOL) 500 MG tablet  Take 1,000 mg by mouth every 6 (six) hours as needed for moderate pain or fever.    [provider]  cyclobenzaprine (FLEXERIL) 10 MG tablet Take 10 mg by mouth 3 (three) times daily. 05/07/20   [provider]  cyclobenzaprine (FLEXERIL) 5 MG tablet Take 1 tablet (5 mg total) by mouth at bedtime. 06/25/19   Elvina Sidle, MD  diclofenac (VOLTAREN) 75 MG EC tablet Take 1 tablet (75 mg total) by mouth 2 (two) times daily. 06/25/19   Elvina Sidle, MD  escitalopram (LEXAPRO) 10 MG tablet escitalopram 10 mg tablet  TAKE 1 TABLET BY MOUTH EVERY DAY    [provider]  gabapentin (NEURONTIN) 300 MG capsule Take 300 mg by mouth 3 (three) times daily. 05/07/20   [provider]  hydrochlorothiazide (HYDRODIURIL) 25 MG tablet Take 25 mg by mouth daily. 05/07/20   [provider]  HYDROcodone-acetaminophen (NORCO) 5-325 MG tablet Take 1 tablet by mouth every 6 (six) hours as needed for moderate pain. 06/25/19   Elvina Sidle, MD  Multiple Vitamin (MULTIVITAMIN WITH MINERALS)  TABS tablet Take 1 tablet by mouth daily.    [provider]  pantoprazole (PROTONIX) 20 MG tablet Take 1 tablet (20 mg total) by mouth daily. 01/22/19   Milagros Loll, MD  phentermine 37.5 MG capsule phentermine 37.5 mg capsule  TAKE 1 CAPSULE BY MOUTH EVERY DAY    [provider]    Family History Family History  Problem Relation Age of Onset  . Hypertension Mother   . Sleep apnea Father   . Diabetes Maternal Grandmother   . Heart disease Maternal Grandmother   . Hypertension Maternal Grandmother   . Gout Maternal Grandmother   . Stroke Paternal Grandmother     Social History Social History   Tobacco Use  . Smoking status: Current Some Day Smoker    Types: Cigarettes  . Smokeless tobacco: Never Used  Substance Use Topics  . Alcohol use: No  . Drug use: No     Allergies   Morphine and related   Review of Systems Review of Systems   Constitutional: Negative for fever.  Musculoskeletal: Positive for arthralgias and joint swelling.     Physical Exam Triage Vital Signs ED Triage Vitals  Enc Vitals Group     BP 05/27/20 1511 122/84     Pulse Rate 05/27/20 1511 84     Resp 05/27/20 1511 20     Temp 05/27/20 1516 98.5 F (36.9 C)     Temp src --      SpO2 05/27/20 1511 98 %     Weight --      Height --      Head Circumference --      Peak Flow --      Pain Score 05/27/20 1510 8     Pain Loc --      Pain Edu? --      Excl. in GC? --    No data found.  Updated Vital Signs BP 122/84   Pulse 84   Temp 98.5 F (36.9 C)   Resp 20   LMP 05/04/2020 (Approximate)   SpO2 98%   Visual Acuity Right Eye Distance:   Left Eye Distance:   Bilateral Distance:    Right Eye Near:   Left Eye Near:    Bilateral Near:     Physical Exam Vitals and nursing note reviewed.  Constitutional:      General: She is not in acute distress.    Appearance: Normal appearance. She is not ill-appearing, toxic-appearing or diaphoretic.  HENT:     Head: Normocephalic and atraumatic.  Eyes:     Conjunctiva/sclera: Conjunctivae normal.  Cardiovascular:     Rate and Rhythm: Normal rate.     Pulses: Normal pulses.  Pulmonary:     Effort: Pulmonary effort is normal.  Abdominal:     General: Abdomen is flat.  Musculoskeletal:        General: Normal range of motion.     Right elbow: No swelling, deformity or effusion. Normal range of motion. Tenderness (generalized tenderness to elbow) present.     Left elbow: Normal.     Cervical back: Normal range of motion.  Skin:    General: Skin is warm and dry.  Neurological:     General: No focal deficit present.     Mental Status: She is alert and oriented to person, place, and time.  Psychiatric:        Mood and Affect: Mood normal.      UC Treatments / Results  Labs (all labs ordered are listed, but only abnormal results are displayed) Labs Reviewed - No data to  display  EKG   Radiology DG Elbow Complete Right  Result Date: 05/27/2020 CLINICAL DATA:  Pain EXAM: RIGHT ELBOW - COMPLETE 3+ VIEW COMPARISON:  None. FINDINGS: Frontal, lateral, and bilateral oblique views were obtained. There is no fracture, dislocation, or joint effusion. The joint spaces appear normal. No erosive change. IMPRESSION: No evident fracture or dislocation.  No evident arthropathy. Electronically Signed   By: Bretta Bang III M.D.   On: 05/27/2020 16:12    Procedures Procedures (including critical care time)  Medications Ordered in UC Medications - No data to display  Initial Impression / Assessment and Plan / UC Course  I have reviewed the triage vital signs and the nursing notes.  Pertinent labs & imaging results that were available during my care of the patient were reviewed by me and considered in my medical decision making (see chart for details).     Right arm pain Right elbow pain Assessment negative for red flags or concerns, including DVT Xray negative for acute injury or fracture Take medications as previously prescribed.   Take Tylenol/Ibuprofen as needed for pain relief.  Follow up with PCP Final Clinical Impressions(s) / UC Diagnoses   Final diagnoses:  Right arm pain  Right elbow pain     Discharge Instructions     Take medications as previously prescribed.   You can take Ibuprofen and Tylenol as needed for pain relief.   Follow up with your Primary Care Provider as needed.     ED Prescriptions    None     PDMP not reviewed this encounter.   Ivette Loyal, NP 05/27/20 1634    Ivette Loyal, NP 05/27/20 [phone]

## 2020-05-27 NOTE — ED Triage Notes (Signed)
Pt in with c/o right arm pain that has been going on for weeks now.  States it feels like a sharp pain and she was seen by her pcp and they did  Blood work but no xrays  Pt has not been taking medication for sxs

## 2020-05-27 NOTE — Discharge Instructions (Addendum)
Take medications as previously prescribed.   You can take Ibuprofen and Tylenol as needed for pain relief.   Follow up with your Primary Care Provider as needed.

## 2020-06-13 ENCOUNTER — Other Ambulatory Visit: Payer: Self-pay

## 2020-06-13 ENCOUNTER — Ambulatory Visit (HOSPITAL_COMMUNITY)
Admission: EM | Admit: 2020-06-13 | Discharge: 2020-06-13 | Disposition: A | Discharge status: Home / Self Care | Payer: Commercial Managed Care - PPO | Attending: Internal Medicine | Admitting: Internal Medicine

## 2020-06-13 ENCOUNTER — Encounter (HOSPITAL_COMMUNITY): Payer: Self-pay | Admitting: Emergency Medicine

## 2020-06-13 DIAGNOSIS — G5602 Carpal tunnel syndrome, left upper limb: Secondary | ICD-10-CM

## 2020-06-13 DIAGNOSIS — M79622 Pain in left upper arm: Secondary | ICD-10-CM | POA: Diagnosis not present

## 2020-06-13 MED ORDER — DICLOFENAC SODIUM 1 % EX GEL: 2.0000 g | Freq: Four times a day (QID) | CUTANEOUS | 0 refills | Status: DC

## 2020-06-13 MED ORDER — IBUPROFEN 600 MG PO TABS: 600.0000 mg | ORAL_TABLET | Freq: Four times a day (QID) | ORAL | 0 refills | Status: DC | PRN

## 2020-06-13 NOTE — Discharge Instructions (Addendum)
Use wrist brace as directed Take medications as directed Gentle range of motion exercises Heating pad use on a 10-minute -10 minutes off cycle will help with the left upper arm pain Return to urgent care or follow-up with PCP if symptoms worsen

## 2020-06-13 NOTE — ED Provider Notes (Signed)
[facility]    CSN: [id number] Arrival date & time: 06/13/20  1836      History   Chief Complaint Chief Complaint  Patient presents with  . Arm Pain    Left     HPI Victoria Rivers is a 45 y.o. female comes to the urgent care with left upper arm pain and left wrist pain of a few days duration.  Symptoms started insidiously and has been persistent.  Patient describes the left upper arm pain as sharp, throbbing, positional, no relieving factors.  Patient has not tried any over-the-counter medications.  Patient denies any swelling or trauma to the left upper extremity.  Patient also complains of numbness and tingling in the index and middle fingers of the left hand.  No trauma to the wrist.  No swelling or discoloration of the fingers.  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   Past Medical History:  Diagnosis Date  . Anxiety    no meds  . BV (bacterial vaginosis)   . Chlamydia   . Gonorrhea   . Hypertension   . Pregnancy, ectopic     Patient Active Problem List   Diagnosis Date Noted  . Acute viral bronchitis 05/05/2014  . Sinus tachycardia 05/05/2014  . Lactic acidosis 05/05/2014  . Nausea and vomiting 05/05/2014  . Hypokalemia 05/05/2014  . Influenza with respiratory manifestations     Past Surgical History:  Procedure Laterality Date  . DILATION AND CURETTAGE OF UTERUS    . ECTOPIC PREGNANCY SURGERY     pt denies, states only received MTX  . THERAPEUTIC ABORTION      OB History    Gravida  7   Para  4   Term  4   Preterm      AB  3   Living  4     SAB      IAB  2   Ectopic  1   Multiple      Live Births  4        Obstetric Comments  MTX for ectopic         Home Medications    Prior to Admission medications   Medication Sig Start Date End Date Taking? Authorizing Provider  cyclobenzaprine (FLEXERIL) 10 MG tablet Take 10 mg by mouth 3 (three) times daily. 05/07/20  Yes [provider]   diclofenac Sodium (VOLTAREN) 1 % GEL Apply 2 g topically 4 (four) times daily. 06/13/20  Yes Verneda Hollopeter, Britta Mccreedy, MD  hydrochlorothiazide (HYDRODIURIL) 25 MG tablet Take 25 mg by mouth daily. 05/07/20  Yes [provider]  ibuprofen (ADVIL) 600 MG tablet Take 1 tablet (600 mg total) by mouth every 6 (six) hours as needed. 06/13/20  Yes Vineeth Fell, Britta Mccreedy, MD  acetaminophen (TYLENOL) 325 MG tablet Take 650 mg by mouth every 6 (six) hours as needed for moderate pain or fever.    [provider]  acetaminophen (TYLENOL) 500 MG tablet Take 1,000 mg by mouth every 6 (six) hours as needed for moderate pain or fever.    [provider]  cyclobenzaprine (FLEXERIL) 5 MG tablet Take 1 tablet (5 mg total) by mouth at bedtime. 06/25/19   Elvina Sidle, MD  escitalopram (LEXAPRO) 10 MG tablet escitalopram 10 mg tablet  TAKE 1 TABLET BY MOUTH EVERY DAY    [provider]  gabapentin (NEURONTIN) 300 MG capsule Take 300 mg by mouth 3 (three) times daily. 05/07/20  [provider]  HYDROcodone-acetaminophen (NORCO) 5-325 MG tablet Take 1 tablet by mouth every 6 (six) hours as needed for moderate pain. 06/25/19   Elvina Sidle, MD  Multiple Vitamin (MULTIVITAMIN WITH MINERALS) TABS tablet Take 1 tablet by mouth daily.    [provider]  pantoprazole (PROTONIX) 20 MG tablet Take 1 tablet (20 mg total) by mouth daily. 01/22/19   Milagros Loll, MD  phentermine 37.5 MG capsule phentermine 37.5 mg capsule  TAKE 1 CAPSULE BY MOUTH EVERY DAY    [provider]    Family History Family History  Problem Relation Age of Onset  . Hypertension Mother   . Sleep apnea Father   . Diabetes Maternal Grandmother   . Heart disease Maternal Grandmother   . Hypertension Maternal Grandmother   . Gout Maternal Grandmother   . Stroke Paternal Grandmother     Social History Social History   Tobacco Use  . Smoking status: Current Some Day Smoker    Types:  Cigarettes  . Smokeless tobacco: Never Used  Substance Use Topics  . Alcohol use: No  . Drug use: No     Allergies   Morphine and related   Review of Systems Review of Systems  Respiratory: Negative.   Gastrointestinal: Negative.   Musculoskeletal: Positive for arthralgias. Negative for myalgias, neck pain and neck stiffness.  Skin: Negative.   Neurological: Negative.      Physical Exam Triage Vital Signs ED Triage Vitals  Enc Vitals Group     BP 06/13/20 1857 (!) 141/85     Pulse Rate 06/13/20 1857 (!) 103     Resp 06/13/20 1857 18     Temp 06/13/20 1857 98.3 F (36.8 C)     Temp Source 06/13/20 1857 Oral     SpO2 06/13/20 1857 96 %     Weight --      Height --      Head Circumference --      Peak Flow --      Pain Score 06/13/20 1850 4     Pain Loc --      Pain Edu? --      Excl. in GC? --    No data found.  Updated Vital Signs BP (!) 141/85 (BP Location: Right Wrist)   Pulse (!) 103   Temp 98.3 F (36.8 C) (Oral)   Resp 18   LMP 06/02/2020   SpO2 96%   Visual Acuity Right Eye Distance:   Left Eye Distance:   Bilateral Distance:    Right Eye Near:   Left Eye Near:    Bilateral Near:     Physical Exam Vitals and nursing note reviewed.  Constitutional:      General: She is not in acute distress.    Appearance: Normal appearance. She is not ill-appearing.  Cardiovascular:     Rate and Rhythm: Normal rate and regular rhythm.  Musculoskeletal:     Comments: Full range of motion around the left shoulder.  Tenderness to palpation of at the left tricep muscles.  Power is 5/5 in both hands and left upper extremity.  No rashes noted.  Skin:    Capillary Refill: Capillary refill takes less than 2 seconds.  Neurological:     Mental Status: She is alert.      UC Treatments / Results  Labs (all labs ordered are listed, but only abnormal results are displayed) Labs Reviewed - No data to display  EKG   Radiology No results  found.  Procedures Procedures (including critical care time)  Medications Ordered in UC Medications - No data to display  Initial Impression / Assessment and Plan / UC Course  I have reviewed the triage vital signs and the nursing notes.  Pertinent labs & imaging results that were available during my care of the patient were reviewed by me and considered in my medical decision making (see chart for details).     1.  Left upper extremity musculoskeletal pain (in the triceps): Ibuprofen as needed for pain Voltaren gel Heating pad on the 10 minutes on-10 minutes off cycle Gentle range of motion exercises If symptoms worsen please return to urgent care  2.  Carpal tunnel syndrome involving the left wrist: Left wrist brace NSAIDs as above If symptoms are persistent patient may need a short course of steroids or hand surgery referral. Return precautions given. Final Clinical Impressions(s) / UC Diagnoses   Final diagnoses:  Carpal tunnel syndrome of left wrist  Left upper arm pain     Discharge Instructions     Use wrist brace as directed Take medications as directed Gentle range of motion exercises Heating pad use on a 10-minute -10 minutes off cycle will help with the left upper arm pain Return to urgent care or follow-up with PCP if symptoms worsen   ED Prescriptions    Medication Sig Dispense Auth. Provider   ibuprofen (ADVIL) 600 MG tablet Take 1 tablet (600 mg total) by mouth every 6 (six) hours as needed. 30 tablet Jermany Rimel, Britta Mccreedy, MD   diclofenac Sodium (VOLTAREN) 1 % GEL Apply 2 g topically 4 (four) times daily. 100 g Sharah Finnell, Britta Mccreedy, MD     PDMP not reviewed this encounter.   Merrilee Jansky, MD 06/13/20 [phone]

## 2020-06-13 NOTE — ED Triage Notes (Signed)
Pt presents with left arm pain. States worst at wrist but starts at left shoulder and goes down.

## 2020-06-24 ENCOUNTER — Other Ambulatory Visit: Payer: Self-pay

## 2020-06-24 ENCOUNTER — Encounter (HOSPITAL_COMMUNITY): Payer: Self-pay | Admitting: *Deleted

## 2020-06-24 ENCOUNTER — Ambulatory Visit (HOSPITAL_COMMUNITY)
Admission: EM | Admit: 2020-06-24 | Discharge: 2020-06-24 | Disposition: A | Discharge status: Home / Self Care | Payer: Commercial Managed Care - PPO | Attending: Family Medicine | Admitting: Family Medicine

## 2020-06-24 DIAGNOSIS — G5602 Carpal tunnel syndrome, left upper limb: Secondary | ICD-10-CM

## 2020-06-24 MED ORDER — PREDNISONE 20 MG PO TABS: 40.0000 mg | ORAL_TABLET | Freq: Every day | ORAL | 0 refills | Status: DC

## 2020-06-24 MED ORDER — CYCLOBENZAPRINE HCL 5 MG PO TABS: 5.0000 mg | ORAL_TABLET | Freq: Every day | ORAL | 0 refills | Status: DC

## 2020-06-24 NOTE — ED Provider Notes (Signed)
[facility]    CSN: [id number] Arrival date & time: 06/24/20  1017      History   Chief Complaint Chief Complaint  Patient presents with  . Wrist Pain    HPI Victoria Rivers is a 45 y.o. female.   Patient presenting today with ongoing left wrist pain for about a month now with numbness and tingling to her middle fingers on this hand.  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  Was seen earlier this month and diagnosed with carpal tunnel syndrome, treated with diclofenac gel and muscle relaxers.  She is also given a wrist brace which she has been using faithfully.  She states it aches constantly in the wrist and movement does exacerbate pain.  Denies new injury, discoloration, significant swelling, history of past injuries to this area.     Past Medical History:  Diagnosis Date  . Anxiety    no meds  . BV (bacterial vaginosis)   . Chlamydia   . Gonorrhea   . Hypertension   . Pregnancy, ectopic     Patient Active Problem List   Diagnosis Date Noted  . Acute viral bronchitis 05/05/2014  . Sinus tachycardia 05/05/2014  . Lactic acidosis 05/05/2014  . Nausea and vomiting 05/05/2014  . Hypokalemia 05/05/2014  . Influenza with respiratory manifestations     Past Surgical History:  Procedure Laterality Date  . DILATION AND CURETTAGE OF UTERUS    . ECTOPIC PREGNANCY SURGERY     pt denies, states only received MTX  . THERAPEUTIC ABORTION      OB History    Gravida  7   Para  4   Term  4   Preterm      AB  3   Living  4     SAB      IAB  2   Ectopic  1   Multiple      Live Births  4        Obstetric Comments  MTX for ectopic         Home Medications    Prior to Admission medications   Medication Sig Start Date End Date Taking? Authorizing Provider  predniSONE (DELTASONE) 20 MG tablet Take 2 tablets (40 mg total) by mouth daily with breakfast.  06/24/20  Yes Particia Nearing, PA-C  acetaminophen (TYLENOL) 325 MG tablet Take 650 mg by mouth every 6 (six) hours as needed for moderate pain or fever.    [provider]  acetaminophen (TYLENOL) 500 MG tablet Take 1,000 mg by mouth every 6 (six) hours as needed for moderate pain or fever.    [provider]  cyclobenzaprine (FLEXERIL) 10 MG tablet Take 10 mg by mouth 3 (three) times daily. 05/07/20   [provider]  cyclobenzaprine (FLEXERIL) 5 MG tablet Take 1 tablet (5 mg total) by mouth at bedtime. 06/24/20   Particia Nearing, PA-C  diclofenac Sodium (VOLTAREN) 1 % GEL Apply 2 g topically 4 (four) times daily. 06/13/20   Lamptey, Britta Mccreedy, MD  escitalopram (LEXAPRO) 10 MG tablet escitalopram 10 mg tablet  TAKE 1 TABLET BY MOUTH EVERY DAY    [provider]  gabapentin (NEURONTIN) 300 MG capsule Take 300 mg by mouth 3 (three) times daily. 05/07/20   [provider]  hydrochlorothiazide (HYDRODIURIL) 25 MG tablet Take 25 mg by mouth daily. 05/07/20  [provider]  HYDROcodone-acetaminophen (NORCO) 5-325 MG tablet Take 1 tablet by mouth every 6 (six) hours as needed for moderate pain. 06/25/19   Elvina Sidle, MD  ibuprofen (ADVIL) 600 MG tablet Take 1 tablet (600 mg total) by mouth every 6 (six) hours as needed. 06/13/20   Merrilee Jansky, MD  Multiple Vitamin (MULTIVITAMIN WITH MINERALS) TABS tablet Take 1 tablet by mouth daily.    [provider]  pantoprazole (PROTONIX) 20 MG tablet Take 1 tablet (20 mg total) by mouth daily. 01/22/19   Milagros Loll, MD  phentermine 37.5 MG capsule phentermine 37.5 mg capsule  TAKE 1 CAPSULE BY MOUTH EVERY DAY    [provider]    Family History Family History  Problem Relation Age of Onset  . Hypertension Mother   . Sleep apnea Father   . Diabetes Maternal Grandmother   . Heart disease Maternal Grandmother   . Hypertension Maternal Grandmother   . Gout Maternal  Grandmother   . Stroke Paternal Grandmother     Social History Social History   Tobacco Use  . Smoking status: Current Some Day Smoker    Types: Cigarettes  . Smokeless tobacco: Never Used  Substance Use Topics  . Alcohol use: No  . Drug use: No     Allergies   Morphine and related   Review of Systems Review of Systems Per HPI  Physical Exam Triage Vital Signs ED Triage Vitals  Enc Vitals Group     BP 06/24/20 1113 114/73     Pulse Rate 06/24/20 1113 96     Resp 06/24/20 1113 18     Temp 06/24/20 1113 98.4 F (36.9 C)     Temp Source 06/24/20 1113 Oral     SpO2 06/24/20 1113 100 %     Weight --      Height --      Head Circumference --      Peak Flow --      Pain Score 06/24/20 1109 6     Pain Loc --      Pain Edu? --      Excl. in GC? --    No data found.  Updated Vital Signs BP 114/73 (BP Location: Right Arm)   Pulse 96   Temp 98.4 F (36.9 C) (Oral)   Resp 18   LMP 06/02/2020   SpO2 100%   Visual Acuity Right Eye Distance:   Left Eye Distance:   Bilateral Distance:    Right Eye Near:   Left Eye Near:    Bilateral Near:     Physical Exam Vitals and nursing note reviewed.  Constitutional:      Appearance: Normal appearance. She is not ill-appearing.  HENT:     Head: Atraumatic.  Eyes:     Extraocular Movements: Extraocular movements intact.     Conjunctiva/sclera: Conjunctivae normal.  Cardiovascular:     Rate and Rhythm: Normal rate and regular rhythm.     Heart sounds: Normal heart sounds.  Pulmonary:     Effort: Pulmonary effort is normal.     Breath sounds: Normal breath sounds.  Musculoskeletal:        General: Normal range of motion.     Cervical back: Normal range of motion and neck supple.     Comments: Wrist brace in place left wrist, good range of motion in all 5 fingers left hand.  Tender to palpation down left forearm diffusely  Skin:    General: Skin  is warm and dry.     Findings: No bruising, erythema or lesion.   Neurological:     Mental Status: She is alert and oriented to person, place, and time.     Comments: Mildly decreased sensation left second through fourth fingers  Psychiatric:        Mood and Affect: Mood normal.        Thought Content: Thought content normal.        Judgment: Judgment normal.      UC Treatments / Results  Labs (all labs ordered are listed, but only abnormal results are displayed) Labs Reviewed - No data to display  EKG   Radiology No results found.  Procedures Procedures (including critical care time)  Medications Ordered in UC Medications - No data to display  Initial Impression / Assessment and Plan / UC Course  I have reviewed the triage vital signs and the nursing notes.  Pertinent labs & imaging results that were available during my care of the patient were reviewed by me and considered in my medical decision making (see chart for details).     We will treat with prednisone, Flexeril, continue wrist brace.  Work note given for rest.  Follow-up with sports medicine for recheck if not fully resolving. Final Clinical Impressions(s) / UC Diagnoses   Final diagnoses:  Carpal tunnel syndrome of left wrist   Discharge Instructions   None    ED Prescriptions    Medication Sig Dispense Auth. Provider   cyclobenzaprine (FLEXERIL) 5 MG tablet Take 1 tablet (5 mg total) by mouth at bedtime. 10 tablet Particia Nearing, [place]   predniSONE (DELTASONE) 20 MG tablet Take 2 tablets (40 mg total) by mouth daily with breakfast. 10 tablet Particia Nearing, [place]     PDMP not reviewed this encounter.   Particia Nearing, [place] 06/24/20 1150

## 2020-06-24 NOTE — ED Triage Notes (Signed)
Pt reports on going Lt wrist pain. Pt has on a wrist splint on arrival to  [facility] . Pt reports she can not preform her job due to the pain in Lt wrist.

## 2020-11-19 ENCOUNTER — Inpatient Hospital Stay (HOSPITAL_COMMUNITY)
Admission: EM | Admit: 2020-11-19 | Discharge: 2020-11-19 | Discharge status: Against Medical Advice | Payer: BLUE CROSS/BLUE SHIELD | Attending: Obstetrics and Gynecology | Admitting: Obstetrics and Gynecology

## 2020-11-19 ENCOUNTER — Encounter (HOSPITAL_COMMUNITY): Payer: Self-pay | Admitting: Emergency Medicine

## 2020-11-19 ENCOUNTER — Inpatient Hospital Stay (HOSPITAL_COMMUNITY): Payer: BLUE CROSS/BLUE SHIELD

## 2020-11-19 ENCOUNTER — Other Ambulatory Visit: Payer: Self-pay

## 2020-11-19 DIAGNOSIS — O26891 Other specified pregnancy related conditions, first trimester: Secondary | ICD-10-CM | POA: Insufficient documentation

## 2020-11-19 DIAGNOSIS — R300 Dysuria: Secondary | ICD-10-CM | POA: Insufficient documentation

## 2020-11-19 DIAGNOSIS — O99331 Smoking (tobacco) complicating pregnancy, first trimester: Secondary | ICD-10-CM | POA: Diagnosis not present

## 2020-11-19 DIAGNOSIS — O3680X0 Pregnancy with inconclusive fetal viability, not applicable or unspecified: Secondary | ICD-10-CM | POA: Insufficient documentation

## 2020-11-19 DIAGNOSIS — O09291 Supervision of pregnancy with other poor reproductive or obstetric history, first trimester: Secondary | ICD-10-CM | POA: Insufficient documentation

## 2020-11-19 DIAGNOSIS — O09521 Supervision of elderly multigravida, first trimester: Secondary | ICD-10-CM | POA: Diagnosis not present

## 2020-11-19 DIAGNOSIS — Z3A01 Less than 8 weeks gestation of pregnancy: Secondary | ICD-10-CM | POA: Diagnosis not present

## 2020-11-19 DIAGNOSIS — F17210 Nicotine dependence, cigarettes, uncomplicated: Secondary | ICD-10-CM | POA: Insufficient documentation

## 2020-11-19 DIAGNOSIS — M549 Dorsalgia, unspecified: Secondary | ICD-10-CM | POA: Insufficient documentation

## 2020-11-19 DIAGNOSIS — R110 Nausea: Secondary | ICD-10-CM | POA: Diagnosis not present

## 2020-11-19 DIAGNOSIS — R1031 Right lower quadrant pain: Secondary | ICD-10-CM | POA: Insufficient documentation

## 2020-11-19 LAB — COMPREHENSIVE METABOLIC PANEL
ALT: 16 U/L (ref 0–44)
AST: 15 U/L (ref 15–41)
Albumin: 3.5 g/dL (ref 3.5–5.0)
Alkaline Phosphatase: 34 U/L — ABNORMAL LOW (ref 38–126)
Anion gap: 9 (ref 5–15)
BUN: 7 mg/dL (ref 6–20)
CO2: 26 mmol/L (ref 22–32)
Calcium: 9 mg/dL (ref 8.9–10.3)
Chloride: 101 mmol/L (ref 98–111)
Creatinine, Ser: 0.65 mg/dL (ref 0.44–1.00)
GFR, Estimated: >60 mL/min (ref >60)
Glucose, Bld: 97 mg/dL (ref 70–99)
Potassium: 3.2 mmol/L — ABNORMAL LOW (ref 3.5–5.1)
Sodium: 136 mmol/L (ref 135–145)
Total Bilirubin: 0.6 mg/dL (ref 0.3–1.2)
Total Protein: 7 g/dL (ref 6.5–8.1)

## 2020-11-19 LAB — HCG, QUANTITATIVE, PREGNANCY: hCG, Beta Chain, Quant, S: 13253 m[IU]/mL — ABNORMAL HIGH (ref <5)

## 2020-11-19 LAB — I-STAT BETA HCG BLOOD, ED (MC, WL, AP ONLY): I-stat hCG, quantitative: >2000 m[IU]/mL — ABNORMAL HIGH (ref <5)

## 2020-11-19 LAB — CBC WITH DIFFERENTIAL/PLATELET
Abs Immature Granulocytes: 0.03 10*3/uL (ref 0.00–0.07)
Basophils Absolute: 0.1 10*3/uL (ref 0.0–0.1)
Basophils Relative: 1 %
Eosinophils Absolute: 0.3 10*3/uL (ref 0.0–0.5)
Eosinophils Relative: 3 %
HCT: 35.1 % — ABNORMAL LOW (ref 36.0–46.0)
Hemoglobin: 10.5 g/dL — ABNORMAL LOW (ref 12.0–15.0)
Immature Granulocytes: 0 %
Lymphocytes Relative: 28 %
Lymphs Abs: 2.3 10*3/uL (ref 0.7–4.0)
MCH: 22.2 pg — ABNORMAL LOW (ref 26.0–34.0)
MCHC: 29.9 g/dL — ABNORMAL LOW (ref 30.0–36.0)
MCV: 74.1 fL — ABNORMAL LOW (ref 80.0–100.0)
Monocytes Absolute: 0.5 10*3/uL (ref 0.1–1.0)
Monocytes Relative: 6 %
Neutro Abs: 5 10*3/uL (ref 1.7–7.7)
Neutrophils Relative %: 62 %
Platelets: 416 10*3/uL — ABNORMAL HIGH (ref 150–400)
RBC: 4.74 MIL/uL (ref 3.87–5.11)
RDW: 15.9 % — ABNORMAL HIGH (ref 11.5–15.5)
WBC: 8.1 10*3/uL (ref 4.0–10.5)
nRBC: 0 % (ref 0.0–0.2)

## 2020-11-19 LAB — URINALYSIS, ROUTINE W REFLEX MICROSCOPIC
Bilirubin Urine: NEGATIVE
Glucose, UA: NEGATIVE mg/dL
Hgb urine dipstick: NEGATIVE
Ketones, ur: NEGATIVE mg/dL
Leukocytes,Ua: NEGATIVE
Nitrite: NEGATIVE
Protein, ur: NEGATIVE mg/dL
Specific Gravity, Urine: 1.01 (ref 1.005–1.030)
pH: 7 (ref 5.0–8.0)

## 2020-11-19 LAB — LIPASE, BLOOD: Lipase: 28 U/L (ref 11–51)

## 2020-11-19 MED ORDER — HYDROMORPHONE HCL 1 MG/ML IJ SOLN
2.0000 mg | Freq: Once | INTRAMUSCULAR | Status: DC
Start: 2020-11-19 — End: 2020-11-19

## 2020-11-19 MED ORDER — DIPHENHYDRAMINE HCL 50 MG/ML IJ SOLN: 25.0000 mg | Freq: Once | INTRAMUSCULAR | Status: DC

## 2020-11-19 NOTE — ED Triage Notes (Signed)
Pt states she has had abd pain for a week- sharp pains into back and sides.Pt took a preg test and it was positive. Pt has hx of ectopic pregnancy and is worried this may be the same.

## 2020-11-19 NOTE — MAU Note (Signed)
pt not ale to stay anymore for results. provider aware. AMA signed

## 2020-11-19 NOTE — ED Provider Notes (Addendum)
Emergency Medicine Provider Triage Evaluation Note  Victoria Rivers , a 45 y.o. female  was evaluated in triage.  Pt complains of abd pain, nv. States this pain feels like her prior ectopic pregnancy. She had a positive home preg test yesterday  Review of Systems  Positive: Abd pain, nv, constipation Negative: Fever, dysuria  Physical Exam  BP 127/64 (BP Location: Left Arm)   Pulse 97   Temp 99.2 F (37.3 C) (Oral)   Resp 18   LMP 10/14/2020   SpO2 100%  Gen:   Awake, no distress   Resp:  Normal effort  MSK:   Moves extremities without difficulty  Other:  Rlq abd pain  Medical Decision Making  Medically screening exam initiated at 1:26 PM.  Appropriate orders placed.  [name] was informed that the remainder of the evaluation will be completed by another provider, this initial triage assessment does not replace that evaluation, and the importance of remaining in the ED until their evaluation is complete.  3:17 PM Contacted Sam at [facility] who accepts patient for transfer   Karrie Meres, PA-C 11/19/20 [address] Charbel Los S, PA-C 11/19/20 1518    Terrilee Files, MD 11/19/20 [phone]

## 2020-11-19 NOTE — MAU Note (Signed)
Having abd pain, started about a wk ago, has gotten stronger. Last night took a preg, was +. Sharp pain in RLQ and low back.  Constant in lower abd- straight across. Was spotting 2 days ago, light pink, none now

## 2020-11-19 NOTE — MAU Provider Note (Signed)
History     CSN: [id number]  Arrival date and time: 11/19/20 1259   Event Date/Time   First Provider Initiated Contact with Patient 11/19/20 1625      Chief Complaint  Patient presents with   Abdominal Pain   Victoria Rivers is a 45 yo G8P4 who [facility] to [facility] with chief complaint of lower abdominal pain. This is a new problem, onset one week ago and worsening over time. Patient radiates to her right mid-abdomen and right flank. Pain score 7-10/10. She denies aggravating or alleviating factors. She has not taken medication or tried other treatments for this complaint. She reports that this pain feels similar to her previous ectopic pregnancy which prompted her to come in to make sure nothing is wrong.    Patient identifies secondary complaint of vaginal spotting. This happened about three days ago but has not been noted since. since that episode. Also having nausea and mild dysuria but denies vomiting or fevers.  OB History     Gravida  8   Para  4   Term  4   Preterm      AB  3   Living  4      SAB      IAB  2   Ectopic  1   Multiple      Live Births  4        Obstetric Comments  MTX for ectopic         Past Medical History:  Diagnosis Date   Anxiety    no meds   BV (bacterial vaginosis)    Chlamydia    Gonorrhea    Hypertension    Pregnancy, ectopic     Past Surgical History:  Procedure Laterality Date   DILATION AND CURETTAGE OF UTERUS     ECTOPIC PREGNANCY SURGERY     pt denies, states only received MTX   THERAPEUTIC ABORTION      Family History  Problem Relation Age of Onset   Hypertension Mother    Sleep apnea Father    Diabetes Maternal Grandmother    Heart disease Maternal Grandmother    Hypertension Maternal Grandmother    Gout Maternal Grandmother    Stroke Paternal Grandmother     Social History   Tobacco Use   Smoking status: Some Days    Types: Cigarettes   Smokeless tobacco: Never  Vaping Use   Vaping Use:  Never used  Substance Use Topics   Alcohol use: No   Drug use: No    Allergies:  Allergies  Allergen Reactions   Morphine And Related Rash    No medications prior to admission.    Review of Systems  HENT:  Negative for congestion and sore throat.        Increased salivation  Respiratory:  Negative for cough.   Gastrointestinal:  Positive for abdominal pain.  All other systems reviewed and are negative. Physical Exam   Blood pressure 114/60, pulse 90, temperature 99.2 F (37.3 C), temperature source Oral, resp. rate 18, height 5\' 6"  (1.676 m), weight 116 kg, last menstrual period 10/14/2020, SpO2 100 %.  Physical Exam Vitals and nursing note reviewed.  Constitutional:      Appearance: She is well-developed. She is ill-appearing.     Comments: Lying in fetal position, appears to be in pain  HENT:     Head: Normocephalic and atraumatic.  Cardiovascular:     Rate and Rhythm: Regular rhythm.  Heart sounds: Normal heart sounds.  Pulmonary:     Effort: Pulmonary effort is normal.     Breath sounds: Normal breath sounds.  Abdominal:     General: Abdomen is flat. There is no distension.     Palpations: Abdomen is soft.     Tenderness: There is abdominal tenderness in the right lower quadrant and suprapubic area. There is no right CVA tenderness, left CVA tenderness, guarding or rebound.  Musculoskeletal:     Comments: Mild tenderness in lumbosacral area bilaterally  Skin:    General: Skin is warm and dry.     Capillary Refill: Capillary refill takes less than 2 seconds.  Neurological:     General: No focal deficit present.     Mental Status: She is alert and oriented to person, place, and time.  Psychiatric:        Mood and Affect: Mood normal.        Behavior: Behavior normal.    [facility]U Course  Procedures  --Hx ectopic pregnancy 2013. Treated with Methotrexate --Patient declines vaginal swabs --Patient declines pain medicine on initial CNM exam and again at 1745  while waiting for [place] results  Orders Placed This Encounter  Procedures   Wet prep, genital   US OB LESS THAN 14 WEEKS WITH OB TRANSVAGINAL   CBC with Differential   Comprehensive metabolic panel   Lipase, blood   Urinalysis, Routine w reflex microscopic   hCG, quantitative, pregnancy   Diet NPO time specified   Nursing communication   I-Stat beta hCG blood, ED   Insert peripheral IV   Patient Vitals for the past 24 hrs:  BP Temp Temp src Pulse Resp SpO2 Height Weight  11/19/20 1601 114/60 -- -- 90 -- 100 % 5\' 6"  (1.676 m) 116 kg  11/19/20 1319 127/64 99.2 F (37.3 C) Oral 97 18 100 % -- --   Results for orders placed or performed during the hospital encounter of 11/19/20 (from the past 24 hour(s))  Urinalysis, Routine w reflex microscopic     Status: None   Collection Time: 11/19/20  2:51 PM  Result Value Ref Range   Color, Urine YELLOW YELLOW   APPearance CLEAR CLEAR   Specific Gravity, Urine 1.010 1.005 - 1.030   pH 7.0 5.0 - 8.0   Glucose, UA NEGATIVE NEGATIVE mg/dL   Hgb urine dipstick NEGATIVE NEGATIVE   Bilirubin Urine NEGATIVE NEGATIVE   Ketones, ur NEGATIVE NEGATIVE mg/dL   Protein, ur NEGATIVE NEGATIVE mg/dL   Nitrite NEGATIVE NEGATIVE   Leukocytes,Ua NEGATIVE NEGATIVE  CBC with Differential     Status: Abnormal   Collection Time: 11/19/20  2:52 PM  Result Value Ref Range   WBC 8.1 4.0 - 10.5 K/uL   RBC 4.74 3.87 - 5.11 MIL/uL   Hemoglobin 10.5 (L) 12.0 - 15.0 g/dL   HCT 11/21/20 (L) 63.0 - 16.0 %   MCV 74.1 (L) 80.0 - 100.0 fL   MCH 22.2 (L) 26.0 - 34.0 pg   MCHC 29.9 (L) 30.0 - 36.0 g/dL   RDW 10.9 (H) 32.3 - 55.7 %   Platelets 416 (H) 150 - 400 K/uL   nRBC 0.0 0.0 - 0.2 %   Neutrophils Relative % 62 %   Neutro Abs 5.0 1.7 - 7.7 K/uL   Lymphocytes Relative 28 %   Lymphs Abs 2.3 0.7 - 4.0 K/uL   Monocytes Relative 6 %   Monocytes Absolute 0.5 0.1 - 1.0 K/uL   Eosinophils Relative 3 %  Eosinophils Absolute 0.3 0.0 - 0.5 K/uL   Basophils Relative 1 %    Basophils Absolute 0.1 0.0 - 0.1 K/uL   Immature Granulocytes 0 %   Abs Immature Granulocytes 0.03 0.00 - 0.07 K/uL  Comprehensive metabolic panel     Status: Abnormal   Collection Time: 11/19/20  2:52 PM  Result Value Ref Range   Sodium 136 135 - 145 mmol/L   Potassium 3.2 (L) 3.5 - 5.1 mmol/L   Chloride 101 98 - 111 mmol/L   CO2 26 22 - 32 mmol/L   Glucose, Bld 97 70 - 99 mg/dL   BUN 7 6 - 20 mg/dL   Creatinine, Ser 1.30 0.44 - 1.00 mg/dL   Calcium 9.0 8.9 - 86.5 mg/dL   Total Protein 7.0 6.5 - 8.1 g/dL   Albumin 3.5 3.5 - 5.0 g/dL   AST 15 15 - 41 U/L   ALT 16 0 - 44 U/L   Alkaline Phosphatase 34 (L) 38 - 126 U/L   Total Bilirubin 0.6 0.3 - 1.2 mg/dL   GFR, Estimated >78 >46 mL/min   Anion gap 9 5 - 15  Lipase, blood     Status: None   Collection Time: 11/19/20  2:52 PM  Result Value Ref Range   Lipase 28 11 - 51 U/L  I-Stat beta hCG blood, ED     Status: Abnormal   Collection Time: 11/19/20  2:57 PM  Result Value Ref Range   I-stat hCG, quantitative >2,000.0 (H) <5 mIU/mL   Comment 3          hCG, quantitative, pregnancy     Status: Abnormal   Collection Time: 11/19/20  4:53 PM  Result Value Ref Range   hCG, Beta Chain, Quant, S 13,253 (H) <5 mIU/mL   US OB LESS THAN 14 WEEKS WITH OB TRANSVAGINAL  Result Date: 11/19/2020 CLINICAL DATA:  Abdominal pain. Last menstrual period 10/14/2020. Gestational age by last menstrual period 5 weeks and 1 day. Estimated due date by last menstrual period 07/21/2021. Quantitative beta HCG 13,253. EXAM: OBSTETRIC <14 WK [place] AND TRANSVAGINAL OB US TECHNIQUE: Both transabdominal and transvaginal ultrasound examinations were performed for complete evaluation of the gestation as well as the maternal uterus, adnexal regions, and pelvic cul-de-sac. Transvaginal technique was performed to assess early pregnancy. COMPARISON:  None. FINDINGS: Intrauterine gestational sac: Single Yolk sac:  Not Visualized. Embryo:  Not Visualized. Cardiac Activity: Not  Visualized. MSD: 7.4  mm   5 w   3  d Subchorionic hemorrhage:  None visualized. Maternal uterus/adnexae: The right ovary measures 2.8 x 2.3 x 1.5 cm. The left ovary measures 3.2 x 2.2 x 1.6 cm. Bilateral ovaries are unremarkable. The uterus is otherwise unremarkable. IMPRESSION: Intrauterine pregnancy with no cardiac activity visualized with mean gestational sac diameter corresponding to a gestational age of [redacted] weeks and 3 days (this is concordant with gestational age by last menstrual period of 5 weeks and 1 day). This likely represents an early pregnancy. Recommend trending of beta HCG and repeat ultrasound in 7-14 days to evaluate for viability. Electronically Signed   By: Tish Frederickson M.D.   On: 11/19/2020 18:12    Assessment and Plan  --45 y.o. [id number] with GS, no YS or FP --Blood type A POS --Patient left AMA prior to receiving results  F/U: --Patient called at home at 1830.  --Results, ectopic precautions, indication for repeat stat Quant hCG reviewed --Patient has appointment for repeat Stat Quant hCG at [facility] 11/22/2020 --Patient  verbalized understanding  Clayton Bibles, MSN, CNM Certified Nurse Midwife, [facility] for [employer], [facility] 11/19/20 6:55 PM

## 2020-11-19 NOTE — MAU Provider Note (Addendum)
History     CSN: [id number]  Arrival date and time: 11/19/20 1259   Event Date/Time   First Provider Initiated Contact with Patient 11/19/20 1625      Chief Complaint  Patient presents with   Abdominal Pain   Victoria Rivers is a 45 yo G8P4 with history of ectopic pregnancy in 2013 who presents for abdominal pain. She had a positive home pregnancy test last week confirmed in the ED today, estimated to be [redacted]w[redacted]d gestation. She reports that for the past week she has been experiencing sharp cramping abdominal pain in her lower abdomen radiating to her RLQ and right lower back. The pain is constant and severe enough to have caused her to miss several days of work. She noticed some light pink spotting a few days ago but has not had any further bleeding/spotting since that episode. Also having nausea and mild dysuria but denies vomiting or fevers. She reports that this pain feels similar to her previous ectopic pregnancy which prompted her to come in to make sure nothing is wrong.   The history is provided by the patient.  Abdominal Pain The primary symptoms of the illness include abdominal pain, nausea, dysuria and vaginal bleeding. The primary symptoms of the illness do not include fever, shortness of breath, vomiting or diarrhea.  The dysuria is not associated with hematuria or frequency.  Additional symptoms associated with the illness include back pain. Symptoms associated with the illness do not include constipation, hematuria or frequency.   OB History     Gravida  8   Para  4   Term  4   Preterm      AB  3   Living  4      SAB      IAB  2   Ectopic  1   Multiple      Live Births  4        Obstetric Comments  MTX for ectopic         Past Medical History:  Diagnosis Date   Anxiety    no meds   BV (bacterial vaginosis)    Chlamydia    Gonorrhea    Hypertension    Pregnancy, ectopic     Past Surgical History:  Procedure Laterality Date   DILATION AND  CURETTAGE OF UTERUS     ECTOPIC PREGNANCY SURGERY     pt denies, states only received MTX   THERAPEUTIC ABORTION      Family History  Problem Relation Age of Onset   Hypertension Mother    Sleep apnea Father    Diabetes Maternal Grandmother    Heart disease Maternal Grandmother    Hypertension Maternal Grandmother    Gout Maternal Grandmother    Stroke Paternal Grandmother     Social History   Tobacco Use   Smoking status: Some Days    Types: Cigarettes   Smokeless tobacco: Never  Vaping Use   Vaping Use: Never used  Substance Use Topics   Alcohol use: No   Drug use: No    Allergies:  Allergies  Allergen Reactions   Morphine And Related Rash    Medications Prior to Admission  Medication Sig Dispense Refill Last Dose   acetaminophen (TYLENOL) 325 MG tablet Take 650 mg by mouth every 6 (six) hours as needed for moderate pain or fever.   Past Month   acetaminophen (TYLENOL) 500 MG tablet Take 1,000 mg by mouth every 6 (six) hours as needed for moderate  pain or fever.   Past Month   Ascorbic Acid (VITAMIN C) 1000 MG tablet Take 1,000 mg by mouth daily.      Cyanocobalamin (VITAMIN B12) 3000 MCG/ML LIQD Place under the tongue.      ELDERBERRY PO Take 1 tablet by mouth daily.      hydrochlorothiazide (HYDRODIURIL) 25 MG tablet Take 25 mg by mouth daily.   11/19/2020   cyclobenzaprine (FLEXERIL) 10 MG tablet Take 10 mg by mouth 3 (three) times daily.      cyclobenzaprine (FLEXERIL) 5 MG tablet Take 1 tablet (5 mg total) by mouth at bedtime. 10 tablet 0    diclofenac Sodium (VOLTAREN) 1 % GEL Apply 2 g topically 4 (four) times daily. 100 g 0    escitalopram (LEXAPRO) 10 MG tablet escitalopram 10 mg tablet  TAKE 1 TABLET BY MOUTH EVERY DAY      gabapentin (NEURONTIN) 300 MG capsule Take 300 mg by mouth 3 (three) times daily.      HYDROcodone-acetaminophen (NORCO) 5-325 MG tablet Take 1 tablet by mouth every 6 (six) hours as needed for moderate pain. 12 tablet 0    ibuprofen  (ADVIL) 600 MG tablet Take 1 tablet (600 mg total) by mouth every 6 (six) hours as needed. 30 tablet 0    Multiple Vitamin (MULTIVITAMIN WITH MINERALS) TABS tablet Take 1 tablet by mouth daily.      pantoprazole (PROTONIX) 20 MG tablet Take 1 tablet (20 mg total) by mouth daily. 30 tablet 0    phentermine 37.5 MG capsule phentermine 37.5 mg capsule  TAKE 1 CAPSULE BY MOUTH EVERY DAY      predniSONE (DELTASONE) 20 MG tablet Take 2 tablets (40 mg total) by mouth daily with breakfast. 10 tablet 0     Review of Systems  Constitutional:  Positive for appetite change. Negative for fever.  HENT:  Negative for congestion and sore throat.        Increased salivation  Respiratory:  Negative for cough and shortness of breath.   Cardiovascular:  Positive for leg swelling. Negative for chest pain and palpitations.  Gastrointestinal:  Positive for abdominal pain and nausea. Negative for constipation, diarrhea and vomiting.  Genitourinary:  Positive for dysuria, pelvic pain and vaginal bleeding. Negative for frequency and hematuria.  Musculoskeletal:  Positive for back pain.  Skin:  Negative for color change.  Neurological:  Positive for light-headedness. Negative for syncope.  All other systems reviewed and are negative. Physical Exam   Blood pressure 114/60, pulse 90, temperature 99.2 F (37.3 C), temperature source Oral, resp. rate 18, height 5\' 6"  (1.676 m), weight 116 kg, last menstrual period 10/14/2020, SpO2 100 %.  Physical Exam Vitals and nursing note reviewed.  Constitutional:      Appearance: She is well-developed. She is ill-appearing.     Comments: Lying in fetal position, appears to be in pain  HENT:     Head: Normocephalic and atraumatic.  Cardiovascular:     Rate and Rhythm: Normal rate and regular rhythm.     Heart sounds: Normal heart sounds.  Pulmonary:     Effort: Pulmonary effort is normal.     Breath sounds: Normal breath sounds.  Abdominal:     General: Abdomen is flat.  There is no distension.     Palpations: Abdomen is soft.     Tenderness: There is abdominal tenderness in the right lower quadrant and suprapubic area. There is no right CVA tenderness, left CVA tenderness, guarding or rebound.  Musculoskeletal:  Comments: Mild tenderness in lumbosacral area bilaterally  Skin:    General: Skin is warm and dry.  Neurological:     General: No focal deficit present.     Mental Status: She is alert and oriented to person, place, and time.    MAU Course  Procedures  MDM Labs from ED reviewed and further labs and imaging ordered. Offered pain medicine but patient denied because she drove herself here.   Assessment and Plan  Patient left AMA prior to receiving [place] results. Informed patient that we will call her with the results.   Karlene Lineman 11/19/2020, 4:45 PM   Attestation of Supervision of Student:  I confirm that I have verified the information documented in the physician assistant student's note and that I have also personally reperformed the history, physical exam and all medical decision making activities.  I have verified that all services and findings are accurately documented in this student's note; and I agree with management and plan as outlined in the documentation. I have also made any necessary editorial changes.  --CNM to call patient with ultrasound results and discussion of followup plan of care --Student note. Please see separate MAU provider note   Calvert Cantor, CNM [facility] [employer], [facility] 11/19/2020 6:46 PM

## 2020-11-20 ENCOUNTER — Telehealth: Payer: Self-pay | Admitting: Advanced Practice Midwife

## 2020-11-20 NOTE — Telephone Encounter (Signed)
Pt who left AMA p Ectopic workup called to ask why she was still hurting.   Was called by Briscoe Deutscher with results of inconclusive [place].  Is scheduled to follow up Friday with repeat HCG.  States pain is LLQ, has had issues w/constipation in the past .Advised to take Tylenol and Miralax. come back if worsens

## 2020-11-22 ENCOUNTER — Other Ambulatory Visit: Payer: Self-pay | Admitting: Advanced Practice Midwife

## 2020-11-22 ENCOUNTER — Ambulatory Visit: Payer: Commercial Managed Care - PPO

## 2020-11-22 ENCOUNTER — Other Ambulatory Visit: Payer: Self-pay

## 2020-11-22 DIAGNOSIS — O0281 Inappropriate change in quantitative human chorionic gonadotropin (hCG) in early pregnancy: Secondary | ICD-10-CM

## 2020-11-22 DIAGNOSIS — Z3200 Encounter for pregnancy test, result unknown: Secondary | ICD-10-CM

## 2020-11-22 LAB — BETA HCG QUANT (REF LAB): hCG Quant: 17820 m[IU]/mL

## 2020-11-22 NOTE — Progress Notes (Signed)
Pt here for repeat stat HCG check. She reports some mild cramping and pain today with no bleeding. Patient advised we will call her once results are back. Ectopic precaution given. Patient voice understanding.  11/19/2020- HCG: 13,252 11/19/2020 HCG: 57,846 Per Dr.Bass patient should have a consult with an provider to discuss ectopic failed pregnancy. LMTCB 12:11PM

## 2020-11-22 NOTE — MAU Note (Addendum)
Accessed on 11/22/2020 1353 chart due to patient phone call regarding Mychart access.

## 2020-11-22 NOTE — Progress Notes (Signed)
Patient called MAU to report she had not received follow-up on her quant hCG results from [facility] this morning. Inappropriate rise in quant hCG. [facility] office closed for the day. Discussed with Dr. Alysia Penna, MAU Attending. Patient to have repeat ultrasound 7-10 days from previous (09/13).  Results for orders placed or performed in visit on 11/22/20 (from the past 24 hour(s))  Beta hCG quant (ref lab)     Status: None   Collection Time: 11/22/20  9:42 AM  Result Value Ref Range   hCG Quant 17,820 mIU/mL   Narrative   Performed at:  01 - [facility] [address]  [address], [place], [place]  [id number] Lab Director: Mila Homer MD, Phone:  [phone]   Order placed for repeat viability scan 09/21 at [facility]. [facility] phone number and directions provided to patient during phone call at about 1400 hours. Ectopic and bleeding precautions reviewed with patient.  Clayton Bibles, MSN, CNM Certified Nurse Midwife, [employer] for [employer], [facility] 11/22/20 2:07 PM

## 2020-11-26 ENCOUNTER — Encounter: Payer: Self-pay | Admitting: Obstetrics & Gynecology

## 2020-11-26 ENCOUNTER — Ambulatory Visit (INDEPENDENT_AMBULATORY_CARE_PROVIDER_SITE_OTHER): Payer: BLUE CROSS/BLUE SHIELD | Admitting: Obstetrics & Gynecology

## 2020-11-26 ENCOUNTER — Other Ambulatory Visit: Payer: Self-pay

## 2020-11-26 VITALS — BP 118/80 | HR 96

## 2020-11-26 DIAGNOSIS — Z3401 Encounter for supervision of normal first pregnancy, first trimester: Secondary | ICD-10-CM | POA: Diagnosis not present

## 2020-11-26 NOTE — Progress Notes (Signed)
Ultrasounds Results Note  SUBJECTIVE HPI:  Ms. Victoria Rivers is a 45 y.o. E3P2951 at [redacted]w[redacted]d by LMP who presents to the [facility] for followup ultrasound results. The patient denies abdominal pain or vaginal bleeding.  Upon review of the patient's records, patient was first seen inED on 9/13 for pain and nausea.   BHCG on that day was 88416.  Ultrasound showed 5.3 week gest sac IUP.  Last seen in MAU on 9/16. BHCG was 60630.    Past Medical History:  Diagnosis Date   Anxiety    no meds   BV (bacterial vaginosis)    Chlamydia    Gonorrhea    Hypertension    Pregnancy, ectopic    Past Surgical History:  Procedure Laterality Date   DILATION AND CURETTAGE OF UTERUS     ECTOPIC PREGNANCY SURGERY     pt denies, states only received MTX   THERAPEUTIC ABORTION     Social History   Socioeconomic History   Marital status: Single    Spouse name: Not on file   Number of children: Not on file   Years of education: Not on file   Highest education level: Not on file  Occupational History   Not on file  Tobacco Use   Smoking status: Some Days    Types: Cigarettes   Smokeless tobacco: Never  Vaping Use   Vaping Use: Never used  Substance and Sexual Activity   Alcohol use: No   Drug use: No   Sexual activity: Yes    Birth control/protection: Condom  Other Topics Concern   Not on file  Social History Narrative   Not on file   Social Determinants of Health   Financial Resource Strain: Not on file  Food Insecurity: Not on file  Transportation Needs: Not on file  Physical Activity: Not on file  Stress: Not on file  Social Connections: Not on file  Intimate Partner Violence: Not on file   Current Outpatient Medications on File Prior to Visit  Medication Sig Dispense Refill   diclofenac Sodium (VOLTAREN) 1 % GEL Apply 2 g topically 4 (four) times daily. 100 g 0   ELDERBERRY PO Take 1 tablet by mouth daily.     gabapentin (NEURONTIN) 300 MG capsule Take 300 mg by mouth 3 (three)  times daily.     Multiple Vitamin (MULTIVITAMIN WITH MINERALS) TABS tablet Take 1 tablet by mouth daily.     pantoprazole (PROTONIX) 20 MG tablet Take 1 tablet (20 mg total) by mouth daily. 30 tablet 0   acetaminophen (TYLENOL) 325 MG tablet Take 650 mg by mouth every 6 (six) hours as needed for moderate pain or fever. (Patient not taking: Reported on 11/26/2020)     acetaminophen (TYLENOL) 500 MG tablet Take 1,000 mg by mouth every 6 (six) hours as needed for moderate pain or fever. (Patient not taking: Reported on 11/26/2020)     Ascorbic Acid (VITAMIN C) 1000 MG tablet Take 1,000 mg by mouth daily. (Patient not taking: Reported on 11/26/2020)     Cyanocobalamin (VITAMIN B12) 3000 MCG/ML LIQD Place under the tongue. (Patient not taking: Reported on 11/26/2020)     cyclobenzaprine (FLEXERIL) 10 MG tablet Take 10 mg by mouth 3 (three) times daily. (Patient not taking: Reported on 11/26/2020)     cyclobenzaprine (FLEXERIL) 5 MG tablet Take 1 tablet (5 mg total) by mouth at bedtime. (Patient not taking: Reported on 11/26/2020) 10 tablet 0   escitalopram (LEXAPRO) 10 MG tablet escitalopram 10  mg tablet  TAKE 1 TABLET BY MOUTH EVERY DAY (Patient not taking: Reported on 11/26/2020)     hydrochlorothiazide (HYDRODIURIL) 25 MG tablet Take 25 mg by mouth daily. (Patient not taking: Reported on 11/26/2020)     HYDROcodone-acetaminophen (NORCO) 5-325 MG tablet Take 1 tablet by mouth every 6 (six) hours as needed for moderate pain. (Patient not taking: Reported on 11/26/2020) 12 tablet 0   ibuprofen (ADVIL) 600 MG tablet Take 1 tablet (600 mg total) by mouth every 6 (six) hours as needed. (Patient not taking: Reported on 11/26/2020) 30 tablet 0   phentermine 37.5 MG capsule phentermine 37.5 mg capsule  TAKE 1 CAPSULE BY MOUTH EVERY DAY (Patient not taking: Reported on 11/26/2020)     predniSONE (DELTASONE) 20 MG tablet Take 2 tablets (40 mg total) by mouth daily with breakfast. (Patient not taking: Reported on 11/26/2020)  10 tablet 0   No current facility-administered medications on file prior to visit.   Allergies  Allergen Reactions   Morphine And Related Rash    I have reviewed patient's Past Medical Hx, Surgical Hx, Family Hx, Social Hx, medications and allergies.   Review of Systems Review of Systems  Constitutional: Negative for fever and chills.  Gastrointestinal: Negative for nausea, vomiting, abdominal pain, diarrhea and constipation.  Genitourinary: Negative for dysuria.  Musculoskeletal: Negative for back pain.  Neurological: Negative for dizziness and weakness.    Physical Exam  BP 118/80   Pulse 96   LMP 10/14/2020   GENERAL: Well-developed, well-nourished female in no acute distress.  HEENT: Normocephalic, atraumatic.   LUNGS: Effort normal ABDOMEN: soft, non-tender HEART: Regular rate  SKIN: Warm, dry and without erythema PSYCH: Normal mood and affect NEURO: Alert and oriented x 4  LAB RESULTS No results found for this or any previous visit (from the past 24 hour(s)).  IMAGING US OB LESS THAN 14 WEEKS WITH OB TRANSVAGINAL  Result Date: 11/19/2020 CLINICAL DATA:  Abdominal pain. Last menstrual period 10/14/2020. Gestational age by last menstrual period 5 weeks and 1 day. Estimated due date by last menstrual period 07/21/2021. Quantitative beta HCG 13,253. EXAM: OBSTETRIC <14 WK [place] AND TRANSVAGINAL OB US TECHNIQUE: Both transabdominal and transvaginal ultrasound examinations were performed for complete evaluation of the gestation as well as the maternal uterus, adnexal regions, and pelvic cul-de-sac. Transvaginal technique was performed to assess early pregnancy. COMPARISON:  None. FINDINGS: Intrauterine gestational sac: Single Yolk sac:  Not Visualized. Embryo:  Not Visualized. Cardiac Activity: Not Visualized. MSD: 7.4  mm   5 w   3  d Subchorionic hemorrhage:  None visualized. Maternal uterus/adnexae: The right ovary measures 2.8 x 2.3 x 1.5 cm. The left ovary measures 3.2 x 2.2  x 1.6 cm. Bilateral ovaries are unremarkable. The uterus is otherwise unremarkable. IMPRESSION: Intrauterine pregnancy with no cardiac activity visualized with mean gestational sac diameter corresponding to a gestational age of [redacted] weeks and 3 days (this is concordant with gestational age by last menstrual period of 5 weeks and 1 day). This likely represents an early pregnancy. Recommend trending of beta HCG and repeat ultrasound in 7-14 days to evaluate for viability. Electronically Signed   By: Tish Frederickson M.D.   On: 11/19/2020 18:12    ASSESSMENT 1. Encounter for supervision of normal first pregnancy in first trimester    Needs repeat US for viability  PLAN Discharge home in stable condition Patient advised to start/continue taking prenatal vitamins Orders Placed This Encounter  Procedures   US OB LESS THAN  14 WEEKS WITH OB TRANSVAGINAL    Standing Status:   Future    Standing Expiration Date:   11/26/2021    Order Specific Question:   Reason for Exam (SYMPTOM  OR DIAGNOSIS REQUIRED)    Answer:   viability    Order Specific Question:   Preferred Imaging Location?    Answer:   [facility]      Adam Phenix, MD  11/26/2020  9:12 AM

## 2020-12-03 ENCOUNTER — Ambulatory Visit: Payer: Commercial Managed Care - PPO | Attending: Advanced Practice Midwife

## 2021-01-23 ENCOUNTER — Encounter (HOSPITAL_COMMUNITY): Payer: Self-pay | Admitting: Emergency Medicine

## 2021-01-23 ENCOUNTER — Other Ambulatory Visit: Payer: Self-pay

## 2021-01-23 ENCOUNTER — Ambulatory Visit (HOSPITAL_COMMUNITY)
Admission: EM | Admit: 2021-01-23 | Discharge: 2021-01-23 | Disposition: A | Discharge status: Home / Self Care | Payer: BLUE CROSS/BLUE SHIELD | Attending: Urgent Care | Admitting: Urgent Care

## 2021-01-23 DIAGNOSIS — R052 Subacute cough: Secondary | ICD-10-CM | POA: Diagnosis present

## 2021-01-23 DIAGNOSIS — B349 Viral infection, unspecified: Secondary | ICD-10-CM | POA: Insufficient documentation

## 2021-01-23 DIAGNOSIS — R52 Pain, unspecified: Secondary | ICD-10-CM | POA: Diagnosis present

## 2021-01-23 LAB — RESPIRATORY PANEL BY PCR
Adenovirus: NOT DETECTED
Bordetella Parapertussis: NOT DETECTED
Bordetella pertussis: NOT DETECTED
Chlamydophila pneumoniae: NOT DETECTED
Coronavirus 229E: NOT DETECTED
Coronavirus HKU1: NOT DETECTED
Coronavirus NL63: NOT DETECTED
Coronavirus OC43: NOT DETECTED
Influenza A H1 2009: NOT DETECTED
Influenza A H1: NOT DETECTED
Influenza A H3: DETECTED — AB
Influenza A: NOT DETECTED
Influenza B: NOT DETECTED
Metapneumovirus: NOT DETECTED
Mycoplasma pneumoniae: NOT DETECTED
Parainfluenza Virus 1: NOT DETECTED
Parainfluenza Virus 2: NOT DETECTED
Parainfluenza Virus 3: NOT DETECTED
Parainfluenza Virus 4: NOT DETECTED
Respiratory Syncytial Virus: NOT DETECTED
Rhinovirus / Enterovirus: NOT DETECTED

## 2021-01-23 MED ORDER — BENZONATATE 100 MG PO CAPS: 100.0000 mg | ORAL_CAPSULE | Freq: Three times a day (TID) | ORAL | 0 refills | Status: DC | PRN

## 2021-01-23 MED ORDER — PSEUDOEPHEDRINE HCL 60 MG PO TABS: 60.0000 mg | ORAL_TABLET | Freq: Three times a day (TID) | ORAL | 0 refills | Status: DC | PRN

## 2021-01-23 MED ORDER — CETIRIZINE HCL 10 MG PO TABS: 10.0000 mg | ORAL_TABLET | Freq: Every day | ORAL | 0 refills | Status: DC

## 2021-01-23 MED ORDER — PROMETHAZINE-DM 6.25-15 MG/5ML PO SYRP: 5.0000 mL | ORAL_SOLUTION | Freq: Every evening | ORAL | 0 refills | Status: DC | PRN

## 2021-01-23 MED ORDER — OSELTAMIVIR PHOSPHATE 75 MG PO CAPS: 75.0000 mg | ORAL_CAPSULE | Freq: Two times a day (BID) | ORAL | 0 refills | Status: DC

## 2021-01-23 NOTE — ED Triage Notes (Signed)
Had cough since Monday, Tuesday added fatigue. Pt started with diarrhea last night. Pt reports having migraine, muscle aches, loss appetite.

## 2021-01-23 NOTE — ED Provider Notes (Signed)
[facility]   MRN: [id number] DOB: 04-Jun-1975  Subjective:   Victoria Rivers is a 45 y.o. female presenting for 2-day history of acute onset fever, coughing, body aches, headaches, diarrhea, decreased appetite.  No chest pain, shortness of breath or wheezing.  Patient is a very light smoker.  No history of asthma or respiratory disorders.  She works amongst [employer].  No current facility-administered medications for this encounter.  Current Outpatient Medications:    acetaminophen (TYLENOL) 325 MG tablet, Take 650 mg by mouth every 6 (six) hours as needed for moderate pain or fever. (Patient not taking: Reported on 11/26/2020), Disp: , Rfl:    acetaminophen (TYLENOL) 500 MG tablet, Take 1,000 mg by mouth every 6 (six) hours as needed for moderate pain or fever. (Patient not taking: Reported on 11/26/2020), Disp: , Rfl:    Ascorbic Acid (VITAMIN C) 1000 MG tablet, Take 1,000 mg by mouth daily. (Patient not taking: Reported on 11/26/2020), Disp: , Rfl:    Cyanocobalamin (VITAMIN B12) 3000 MCG/ML LIQD, Place under the tongue. (Patient not taking: Reported on 11/26/2020), Disp: , Rfl:    cyclobenzaprine (FLEXERIL) 10 MG tablet, Take 10 mg by mouth 3 (three) times daily. (Patient not taking: Reported on 11/26/2020), Disp: , Rfl:    cyclobenzaprine (FLEXERIL) 5 MG tablet, Take 1 tablet (5 mg total) by mouth at bedtime. (Patient not taking: Reported on 11/26/2020), Disp: 10 tablet, Rfl: 0   diclofenac Sodium (VOLTAREN) 1 % GEL, Apply 2 g topically 4 (four) times daily., Disp: 100 g, Rfl: 0   ELDERBERRY PO, Take 1 tablet by mouth daily., Disp: , Rfl:    escitalopram (LEXAPRO) 10 MG tablet, escitalopram 10 mg tablet  TAKE 1 TABLET BY MOUTH EVERY DAY (Patient not taking: Reported on 11/26/2020), Disp: , Rfl:    gabapentin (NEURONTIN) 300 MG capsule, Take 300 mg by mouth 3 (three) times daily., Disp: , Rfl:    hydrochlorothiazide (HYDRODIURIL) 25 MG tablet, Take 25 mg by mouth daily. (Patient  not taking: Reported on 11/26/2020), Disp: , Rfl:    HYDROcodone-acetaminophen (NORCO) 5-325 MG tablet, Take 1 tablet by mouth every 6 (six) hours as needed for moderate pain. (Patient not taking: Reported on 11/26/2020), Disp: 12 tablet, Rfl: 0   ibuprofen (ADVIL) 600 MG tablet, Take 1 tablet (600 mg total) by mouth every 6 (six) hours as needed. (Patient not taking: Reported on 11/26/2020), Disp: 30 tablet, Rfl: 0   Multiple Vitamin (MULTIVITAMIN WITH MINERALS) TABS tablet, Take 1 tablet by mouth daily., Disp: , Rfl:    pantoprazole (PROTONIX) 20 MG tablet, Take 1 tablet (20 mg total) by mouth daily., Disp: 30 tablet, Rfl: 0   phentermine 37.5 MG capsule, phentermine 37.5 mg capsule  TAKE 1 CAPSULE BY MOUTH EVERY DAY (Patient not taking: Reported on 11/26/2020), Disp: , Rfl:    predniSONE (DELTASONE) 20 MG tablet, Take 2 tablets (40 mg total) by mouth daily with breakfast. (Patient not taking: Reported on 11/26/2020), Disp: 10 tablet, Rfl: 0   Allergies  Allergen Reactions   Morphine And Related Rash    Past Medical History:  Diagnosis Date   Anxiety    no meds   BV (bacterial vaginosis)    Chlamydia    Gonorrhea    Hypertension    Pregnancy, ectopic      Past Surgical History:  Procedure Laterality Date   DILATION AND CURETTAGE OF UTERUS     ECTOPIC PREGNANCY SURGERY     pt  denies, states only received MTX   THERAPEUTIC ABORTION      Family History  Problem Relation Age of Onset   Hypertension Mother    Sleep apnea Father    Diabetes Maternal Grandmother    Heart disease Maternal Grandmother    Hypertension Maternal Grandmother    Gout Maternal Grandmother    Stroke Paternal Grandmother     Social History   Tobacco Use   Smoking status: Some Days    Types: Cigarettes   Smokeless tobacco: Never  Vaping Use   Vaping Use: Never used  Substance Use Topics   Alcohol use: No   Drug use: No    ROS   Objective:   Vitals: BP 124/81 (BP Location: Left Arm)   Pulse  88   Temp 99 F (37.2 C) (Oral)   Resp 19   LMP 12/24/2020   SpO2 96%   Breastfeeding Unknown   Physical Exam Constitutional:      General: She is not in acute distress.    Appearance: Normal appearance. She is well-developed. She is not ill-appearing, toxic-appearing or diaphoretic.  HENT:     Head: Normocephalic and atraumatic.     Right Ear: Tympanic membrane and ear canal normal. No drainage or tenderness. No middle ear effusion. Tympanic membrane is not erythematous.     Left Ear: Tympanic membrane and ear canal normal. No drainage or tenderness.  No middle ear effusion. Tympanic membrane is not erythematous.     Nose: Congestion present. No rhinorrhea.     Mouth/Throat:     Mouth: Mucous membranes are moist. No oral lesions.     Pharynx: No pharyngeal swelling, oropharyngeal exudate, posterior oropharyngeal erythema or uvula swelling.     Tonsils: No tonsillar exudate or tonsillar abscesses.  Eyes:     Extraocular Movements: Extraocular movements intact.     Right eye: Normal extraocular motion.     Left eye: Normal extraocular motion.     Conjunctiva/sclera: Conjunctivae normal.     Pupils: Pupils are equal, round, and reactive to light.  Cardiovascular:     Rate and Rhythm: Normal rate and regular rhythm.     Pulses: Normal pulses.     Heart sounds: Normal heart sounds. No murmur heard.   No friction rub. No gallop.  Pulmonary:     Effort: Pulmonary effort is normal. No respiratory distress.     Breath sounds: Normal breath sounds. No stridor. No wheezing, rhonchi or rales.  Musculoskeletal:     Cervical back: Normal range of motion and neck supple.  Lymphadenopathy:     Cervical: No cervical adenopathy.  Skin:    General: Skin is warm and dry.     Findings: No rash.  Neurological:     General: No focal deficit present.     Mental Status: She is alert and oriented to person, place, and time.     Cranial Nerves: No cranial nerve deficit.     Motor: No weakness.      Coordination: Coordination normal.     Gait: Gait normal.     Deep Tendon Reflexes: Reflexes normal.  Psychiatric:        Mood and Affect: Mood normal.        Behavior: Behavior normal.        Thought Content: Thought content normal.    Assessment and Plan :   PDMP not reviewed this encounter.  1. Acute viral syndrome   2. Subacute cough   3. Body aches  Testing pending for work.  Will cover for influenza with Tamiflu given exposure, symptom set, current incidence in the community.  Use supportive care, rest, fluids, hydration, light meals, schedule Tylenol and ibuprofen. Deferred imaging given clear cardiopulmonary exam, hemodynamically stable vital signs. Counseled patient on potential for adverse effects with medications prescribed today, patient verbalized understanding. ER and return-to-clinic precautions discussed, patient verbalized understanding.    Wallis Bamberg, PA-C 01/23/21 1237

## 2021-01-23 NOTE — Discharge Instructions (Signed)

## 2021-05-29 ENCOUNTER — Ambulatory Visit (HOSPITAL_COMMUNITY)
Admission: EM | Admit: 2021-05-29 | Discharge: 2021-05-29 | Disposition: A | Discharge status: Home / Self Care | Payer: Commercial Managed Care - PPO | Attending: Nurse Practitioner | Admitting: Nurse Practitioner

## 2021-05-29 ENCOUNTER — Encounter (HOSPITAL_COMMUNITY): Payer: Self-pay | Admitting: Emergency Medicine

## 2021-05-29 DIAGNOSIS — B9789 Other viral agents as the cause of diseases classified elsewhere: Secondary | ICD-10-CM | POA: Diagnosis not present

## 2021-05-29 DIAGNOSIS — Z20822 Contact with and (suspected) exposure to covid-19: Secondary | ICD-10-CM | POA: Diagnosis not present

## 2021-05-29 DIAGNOSIS — R0981 Nasal congestion: Secondary | ICD-10-CM | POA: Insufficient documentation

## 2021-05-29 DIAGNOSIS — J329 Chronic sinusitis, unspecified: Secondary | ICD-10-CM | POA: Diagnosis not present

## 2021-05-29 MED ORDER — FLUTICASONE PROPIONATE 50 MCG/ACT NA SUSP: 2.0000 | Freq: Every day | NASAL | 0 refills | Status: DC

## 2021-05-29 MED ORDER — CETIRIZINE HCL 10 MG PO TABS: 10.0000 mg | ORAL_TABLET | Freq: Every day | ORAL | 0 refills | Status: DC

## 2021-05-29 MED ORDER — PSEUDOEPH-BROMPHEN-DM 30-2-10 MG/5ML PO SYRP: 5.0000 mL | ORAL_SOLUTION | Freq: Four times a day (QID) | ORAL | 0 refills | Status: AC | PRN

## 2021-05-29 NOTE — ED Provider Notes (Signed)
[facility]    CSN: [id number] Arrival date & time: 05/29/21  1752      History   Chief Complaint Chief Complaint  Patient presents with   Nasal Congestion   Cough    HPI Victoria Rivers is a 46 y.o. female.   The patient is a 45 year old female who presents with cough and nasal congestion.  Patient states symptoms have been present for the past 2 days.  She informs that she took her grandchildren to the zoo and was outside prior to the onset of her symptoms.  Today she continues to complain of chest congestion, nasal congestion, fatigue, and a mild cough.  She states that she is not coughing a lot at this time.  She states that she is coughing up green sputum.  She denies any wheezing or shortness of breath.  She denies any recent sick contacts.  She eating and drinking normally.   Cough Cough characteristics:  Productive Sputum characteristics:  Green Timing:  Intermittent Progression:  Waxing and waning Context: exposure to allergens and occupational exposure   Context: not sick contacts and not upper respiratory infection   Associated symptoms: headaches, rhinorrhea and sinus congestion   Associated symptoms: no chest pain, no ear pain, no fever, no shortness of breath, no sore throat and no wheezing    Past Medical History:  Diagnosis Date   Anxiety    no meds   BV (bacterial vaginosis)    Chlamydia    Gonorrhea    Hypertension    Pregnancy, ectopic     Patient Active Problem List   Diagnosis Date Noted   Acute viral bronchitis 05/05/2014   Sinus tachycardia 05/05/2014   Lactic acidosis 05/05/2014   Nausea and vomiting 05/05/2014   Hypokalemia 05/05/2014   Influenza with respiratory manifestations     Past Surgical History:  Procedure Laterality Date   DILATION AND CURETTAGE OF UTERUS     ECTOPIC PREGNANCY SURGERY     pt denies, states only received MTX   THERAPEUTIC ABORTION      OB History     Gravida  8   Para  4   Term  4    Preterm      AB  3   Living  4      SAB      IAB  2   Ectopic  1   Multiple      Live Births  4        Obstetric Comments  MTX for ectopic          Home Medications    Prior to Admission medications   Medication Sig Start Date End Date Taking? Authorizing Provider  brompheniramine-pseudoephedrine-DM 30-2-10 MG/5ML syrup Take 5 mLs by mouth 4 (four) times daily as needed for up to 7 days. 05/29/21 06/05/21 Yes Leath-Warren, Sadie Haber, NP  cetirizine (ZYRTEC) 10 MG tablet Take 1 tablet (10 mg total) by mouth daily. 05/29/21 06/28/21 Yes Leath-Warren, Sadie Haber, NP  fluticasone (FLONASE) 50 MCG/ACT nasal spray Place 2 sprays into both nostrils daily for 14 days. 05/29/21 06/12/21 Yes Leath-Warren, Sadie Haber, NP  acetaminophen (TYLENOL) 325 MG tablet Take 650 mg by mouth every 6 (six) hours as needed for moderate pain or fever. Patient not taking: Reported on 11/26/2020    [provider]  acetaminophen (TYLENOL) 500 MG tablet Take 1,000 mg by mouth every 6 (six) hours as needed for moderate pain or fever. Patient not taking: Reported on 11/26/2020  [provider]  Ascorbic Acid (VITAMIN C) 1000 MG tablet Take 1,000 mg by mouth daily. Patient not taking: Reported on 11/26/2020    [provider]  benzonatate (TESSALON) 100 MG capsule Take 1-2 capsules (100-200 mg total) by mouth 3 (three) times daily as needed for cough. 01/23/21   Wallis Bamberg, PA-C  Cyanocobalamin (VITAMIN B12) 3000 MCG/ML LIQD Place under the tongue. Patient not taking: Reported on 11/26/2020    [provider]  cyclobenzaprine (FLEXERIL) 10 MG tablet Take 10 mg by mouth 3 (three) times daily. Patient not taking: Reported on 11/26/2020 05/07/20   [provider]  cyclobenzaprine (FLEXERIL) 5 MG tablet Take 1 tablet (5 mg total) by mouth at bedtime. Patient not taking: Reported on 11/26/2020 06/24/20   Particia Nearing, PA-C  diclofenac Sodium (VOLTAREN) 1 % GEL  Apply 2 g topically 4 (four) times daily. 06/13/20   Merrilee Jansky, MD  ELDERBERRY PO Take 1 tablet by mouth daily.    [provider]  escitalopram (LEXAPRO) 10 MG tablet escitalopram 10 mg tablet  TAKE 1 TABLET BY MOUTH EVERY DAY Patient not taking: Reported on 11/26/2020    [provider]  gabapentin (NEURONTIN) 300 MG capsule Take 300 mg by mouth 3 (three) times daily. 05/07/20   [provider]  hydrochlorothiazide (HYDRODIURIL) 25 MG tablet Take 25 mg by mouth daily. Patient not taking: Reported on 11/26/2020 05/07/20   [provider]  HYDROcodone-acetaminophen (NORCO) 5-325 MG tablet Take 1 tablet by mouth every 6 (six) hours as needed for moderate pain. Patient not taking: Reported on 11/26/2020 06/25/19   Elvina Sidle, MD  ibuprofen (ADVIL) 600 MG tablet Take 1 tablet (600 mg total) by mouth every 6 (six) hours as needed. Patient not taking: Reported on 11/26/2020 06/13/20   Merrilee Jansky, MD  Multiple Vitamin (MULTIVITAMIN WITH MINERALS) TABS tablet Take 1 tablet by mouth daily.    [provider]  oseltamivir (TAMIFLU) 75 MG capsule Take 1 capsule (75 mg total) by mouth 2 (two) times daily. 01/23/21   Wallis Bamberg, PA-C  pantoprazole (PROTONIX) 20 MG tablet Take 1 tablet (20 mg total) by mouth daily. 01/22/19   Milagros Loll, MD  phentermine 37.5 MG capsule phentermine 37.5 mg capsule  TAKE 1 CAPSULE BY MOUTH EVERY DAY Patient not taking: Reported on 11/26/2020    [provider]  predniSONE (DELTASONE) 20 MG tablet Take 2 tablets (40 mg total) by mouth daily with breakfast. Patient not taking: Reported on 11/26/2020 06/24/20   Particia Nearing, PA-C  promethazine-dextromethorphan (PROMETHAZINE-DM) 6.25-15 MG/5ML syrup Take 5 mLs by mouth at bedtime as needed for cough. 01/23/21   Wallis Bamberg, PA-C  pseudoephedrine (SUDAFED) 60 MG tablet Take 1 tablet (60 mg total) by mouth every 8 (eight) hours as needed for congestion.  01/23/21   Wallis Bamberg, PA-C    Family History Family History  Problem Relation Age of Onset   Hypertension Mother    Sleep apnea Father    Diabetes Maternal Grandmother    Heart disease Maternal Grandmother    Hypertension Maternal Grandmother    Gout Maternal Grandmother    Stroke Paternal Grandmother     Social History Social History   Tobacco Use   Smoking status: Some Days    Types: Cigarettes   Smokeless tobacco: Never  Vaping Use   Vaping Use: Never used  Substance Use Topics   Alcohol use: No   Drug use: No  Allergies   Morphine and related   Review of Systems Review of Systems  Constitutional:  Positive for fatigue. Negative for activity change, appetite change and fever.  HENT:  Positive for congestion, rhinorrhea and sinus pressure. Negative for ear pain, sinus pain, sneezing and sore throat.   Eyes: Negative.   Respiratory:  Positive for cough. Negative for shortness of breath and wheezing.   Cardiovascular:  Negative for chest pain.  Gastrointestinal: Negative.   Skin: Negative.   Neurological:  Positive for headaches.  Psychiatric/Behavioral: Negative.      Physical Exam Triage Vital Signs ED Triage Vitals  Enc Vitals Group     BP 05/29/21 1810 (!) 105/43     Pulse Rate 05/29/21 1810 98     Resp 05/29/21 1810 18     Temp 05/29/21 1813 98.6 F (37 C)     Temp Source 05/29/21 1810 Oral     SpO2 05/29/21 1810 100 %     Weight --      Height --      Head Circumference --      Peak Flow --      Pain Score 05/29/21 1808 6     Pain Loc --      Pain Edu? --      Excl. in GC? --    No data found.  Updated Vital Signs BP 111/64 (BP Location: Left Arm)   Pulse 98   Temp 98.6 F (37 C) (Oral)   Resp 18   LMP 05/21/2021   SpO2 100%   Visual Acuity Right Eye Distance:   Left Eye Distance:   Bilateral Distance:    Right Eye Near:   Left Eye Near:    Bilateral Near:     Physical Exam Vitals reviewed.  Constitutional:       Appearance: Normal appearance.  HENT:     Head: Normocephalic and atraumatic.     Right Ear: Tympanic membrane, ear canal and external ear normal.     Left Ear: Tympanic membrane, ear canal and external ear normal.     Nose: Congestion and rhinorrhea present.     Right Turbinates: Enlarged and swollen.     Left Turbinates: Enlarged and swollen.     Right Sinus: Maxillary sinus tenderness present.     Left Sinus: Maxillary sinus tenderness present.     Mouth/Throat:     Mouth: Mucous membranes are moist.     Pharynx: Posterior oropharyngeal erythema present. No oropharyngeal exudate.     Comments: Moderate PND Eyes:     Extraocular Movements: Extraocular movements intact.     Conjunctiva/sclera: Conjunctivae normal.     Pupils: Pupils are equal, round, and reactive to light.  Cardiovascular:     Rate and Rhythm: Normal rate and regular rhythm.     Pulses: Normal pulses.     Heart sounds: Normal heart sounds.  Pulmonary:     Effort: Pulmonary effort is normal.     Breath sounds: Normal breath sounds.  Abdominal:     General: Bowel sounds are normal.     Palpations: Abdomen is soft.     Tenderness: There is no abdominal tenderness.  Musculoskeletal:     Cervical back: Normal range of motion and neck supple.  Lymphadenopathy:     Cervical: No cervical adenopathy.  Skin:    General: Skin is warm and dry.  Neurological:     Mental Status: She is alert and oriented to person, place, and time.  Psychiatric:  Mood and Affect: Mood normal.        Behavior: Behavior normal.     UC Treatments / Results  Labs (all labs ordered are listed, but only abnormal results are displayed) Labs Reviewed  SARS CORONAVIRUS 2 (TAT 6-24 HRS)    EKG   Radiology No results found.  Procedures Procedures (including critical care time)  Medications Ordered in UC Medications - No data to display  Initial Impression / Assessment and Plan / UC Course  I have reviewed the triage vital  signs and the nursing notes.  Pertinent labs & imaging results that were available during my care of the patient were reviewed by me and considered in my medical decision making (see chart for details).  Patient presents with a 2-day history of nasal congestion and cough.  Patient denies fever, chills, and reports that cough is moderate.  She does complain of chest congestion, and has a productive cough.  There is no concern for a bacterial infection at this time as patient's vital signs are stable, she is afebrile.  Discussion with patient regarding trying symptomatic treatment to include cetirizine, Bromfed, and fluticasone.  We will get a COVID test today, patient was in agreement.  Was encouraged to try medications for the next 5 to 7 days to see if symptoms worsen or if they improve.  Patient was encouraged to follow-up if she has any worsening.  Also recommend supportive care to include increasing fluids and getting plenty of rest.  May take over-the-counter ibuprofen or Tylenol as needed for pain, fever, or general discomfort.  Follow-up for worsening of symptoms. Final Clinical Impressions(s) / UC Diagnoses   Final diagnoses:  Viral sinusitis     Discharge Instructions      COVID test was performed today we will contact you if your results are positive. Take medications as prescribed. Supportive care to include increasing rest, getting plenty of fluids, and taking over-the-counter medications such as ibuprofen or Tylenol for pain, fever, or general discomfort. Continue to monitor your symptoms for any worsening, follow-up as needed. If your symptoms worsen or do not improve, follow-up within the next     ED Prescriptions     Medication Sig Dispense Auth. Provider   brompheniramine-pseudoephedrine-DM 30-2-10 MG/5ML syrup Take 5 mLs by mouth 4 (four) times daily as needed for up to 7 days. 140 mL Leath-Warren, Sadie Haber, NP   cetirizine (ZYRTEC) 10 MG tablet Take 1 tablet (10 mg  total) by mouth daily. 30 tablet Leath-Warren, Sadie Haber, NP   fluticasone (FLONASE) 50 MCG/ACT nasal spray Place 2 sprays into both nostrils daily for 14 days. 16 g Leath-Warren, Sadie Haber, NP      PDMP not reviewed this encounter.   Abran Cantor, NP 05/29/21 1921

## 2021-05-29 NOTE — ED Triage Notes (Signed)
Pt having cough and congestion for 2 days. Reports mucous is yellow-green ?

## 2021-05-29 NOTE — Discharge Instructions (Addendum)
COVID test was performed today we will contact you if your results are positive. ?Take medications as prescribed. ?Supportive care to include increasing rest, getting plenty of fluids, and taking over-the-counter medications such as ibuprofen or Tylenol for pain, fever, or general discomfort. ?Continue to monitor your symptoms for any worsening, follow-up as needed. ?If your symptoms worsen or do not improve, follow-up within the next ?

## 2021-05-30 LAB — SARS CORONAVIRUS 2 (TAT 6-24 HRS): SARS Coronavirus 2: NEGATIVE

## 2021-06-21 ENCOUNTER — Other Ambulatory Visit: Payer: Self-pay | Admitting: Nurse Practitioner

## 2021-10-16 ENCOUNTER — Ambulatory Visit (HOSPITAL_COMMUNITY)
Admission: EM | Admit: 2021-10-16 | Discharge: 2021-10-16 | Disposition: A | Discharge status: Home / Self Care | Payer: Commercial Managed Care - PPO | Attending: Internal Medicine | Admitting: Internal Medicine

## 2021-10-16 ENCOUNTER — Encounter (HOSPITAL_COMMUNITY): Payer: Self-pay | Admitting: Emergency Medicine

## 2021-10-16 DIAGNOSIS — J069 Acute upper respiratory infection, unspecified: Secondary | ICD-10-CM | POA: Diagnosis not present

## 2021-10-16 DIAGNOSIS — J029 Acute pharyngitis, unspecified: Secondary | ICD-10-CM

## 2021-10-16 MED ORDER — PROMETHAZINE-DM 6.25-15 MG/5ML PO SYRP: 5.0000 mL | ORAL_SOLUTION | Freq: Every evening | ORAL | 0 refills | Status: DC | PRN

## 2021-10-16 MED ORDER — ACETAMINOPHEN 500 MG PO TABS: 1000.0000 mg | ORAL_TABLET | Freq: Four times a day (QID) | ORAL | 1 refills | Status: DC | PRN

## 2021-10-16 MED ORDER — IBUPROFEN 600 MG PO TABS: 600.0000 mg | ORAL_TABLET | Freq: Four times a day (QID) | ORAL | 0 refills | Status: DC | PRN

## 2021-10-16 MED ORDER — IBUPROFEN 800 MG PO TABS
ORAL_TABLET | ORAL | Status: AC
  Filled 2021-10-16: qty 1

## 2021-10-16 MED ORDER — IBUPROFEN 800 MG PO TABS
800.0000 mg | ORAL_TABLET | Freq: Once | ORAL | Status: AC
  Administered 2021-10-16: 800 mg via ORAL

## 2021-10-16 MED ORDER — GUAIFENESIN ER 1200 MG PO TB12: 1200.0000 mg | ORAL_TABLET | Freq: Two times a day (BID) | ORAL | 0 refills | Status: DC

## 2021-10-16 NOTE — Discharge Instructions (Addendum)
You have a viral upper respiratory infection.  COVID-19 testing is pending.  We will call you if your result was positive.  If you have a positive result, please stay at home for the next 2 days.  After 2 days, you may go out in the public but you must wear a mask for 5 more days to prevent spread of COVID-19 to others.  Wash your hands frequently.   Take guaifenesin 1200mg   2 times daily to thin your mucous so that you can cough it up and blow it out of your nose easier. Drink plenty of water while taking this medication so that it works well in your body (at least 8 cups a day).   Take Promethazine DM cough medication to help with your cough at nighttime so that you are able to sleep. Do not drive, drink alcohol, or go to work while taking this medication since it can make you sleepy. Only take this at nighttime.   You may take tylenol 1,000mg  and ibuprofen 600mg  every 6 hours with food as needed for fever/chills, sore throat, aches/pains, and inflammation associated with viral illness. Take this with food to avoid stomach upset.    Your next dose of tylenol may be when you get home.  Your next dose of ibuprofen may be at 9:30 tonight since you were  given some in the clinic today.   You may do salt water and baking soda gargles every 4 hours as needed for your throat pain.  Please put 1 teaspoon of salt and 1/2 teaspoon of baking soda in 8 ounces of warm water then gargle and spit the water out. You may also put 1 tablespoon of honey in warm water and drink this to soothe your throat.  Place a humidifier in your room at night to help decrease dry air that can irritate your airway and cause you to have a sore throat and cough.  Please try to eat a well-balanced diet while you are sick so that your body gets proper nutrition to heal.  If you develop any new or worsening symptoms, please return.  If your symptoms are severe, please go to the emergency room.  Follow-up with your primary care provider  for further evaluation and management of your symptoms as well as ongoing wellness visits.  I hope you feel better!

## 2021-10-16 NOTE — ED Triage Notes (Signed)
Pt reports bilateral ear pain, sore throat, cough and generalized body aches. States have tried [employer] with some relief.

## 2021-10-16 NOTE — ED Provider Notes (Signed)
[facility]    CSN: [id number] Arrival date & time: 10/16/21  1206      History   Chief Complaint Chief Complaint  Patient presents with   Sore Throat   Otalgia   Cough   Generalized Body Aches    HPI Victoria Rivers is a 46 y.o. female.   Patient presents urgent care for evaluation of generalized bodyaches, productive cough, sore throat, headache, stuffy nose, and ear pain for the last 3 days.  Patient states that her symptoms started with a "migraine" and progressively worsened into URI symptoms.  Patient denies blurry vision or decreased visual acuity.  She is also reporting a loss of appetite but relates this to her diet/weight loss medications. She is able to taste and smell.  States that her symptoms improved this morning and she was able to go to work, but she became very fatigued while she was at work and became worried that she may be developing a pneumonia or need antibiotics.  Cough is productive, worse at nighttime, and disrupts her sleep. Nasal congestion is thick and "slimy".  She has attempted use of over the counter cold and flu medications (alka seltzer plus) without much relief of her symptoms. No fever/chills reported. Denies nausea, vomiting, diarrhea, constipation, dizziness, urinary symptoms, chest pain, shortness of breath, difficulty taking in a full deep breath, and history of chronic respiratory problems. She smokes cigarettes intermittently, not daily. Denies other drug use.    Sore Throat  Otalgia Associated symptoms: cough   Cough Associated symptoms: ear pain   Tge=  Past Medical History:  Diagnosis Date   Anxiety    no meds   BV (bacterial vaginosis)    Chlamydia    Gonorrhea    Hypertension    Pregnancy, ectopic     Patient Active Problem List   Diagnosis Date Noted   Acute viral bronchitis 05/05/2014   Sinus tachycardia 05/05/2014   Lactic acidosis 05/05/2014   Nausea and vomiting 05/05/2014   Hypokalemia 05/05/2014    Influenza with respiratory manifestations     Past Surgical History:  Procedure Laterality Date   DILATION AND CURETTAGE OF UTERUS     ECTOPIC PREGNANCY SURGERY     pt denies, states only received MTX   THERAPEUTIC ABORTION      OB History     Gravida  8   Para  4   Term  4   Preterm      AB  3   Living  4      SAB      IAB  2   Ectopic  1   Multiple      Live Births  4        Obstetric Comments  MTX for ectopic          Home Medications    Prior to Admission medications   Medication Sig Start Date End Date Taking? Authorizing Provider  Guaifenesin 1200 MG TB12 Take 1 tablet (1,200 mg total) by mouth in the morning and at bedtime. 10/16/21  Yes Carlisle BeersStanhope, Lealon Vanputten M, FNP  promethazine-dextromethorphan (PROMETHAZINE-DM) 6.25-15 MG/5ML syrup Take 5 mLs by mouth at bedtime as needed for cough. 10/16/21  Yes Carlisle BeersStanhope, Devin Foskey M, FNP  acetaminophen (TYLENOL) 500 MG tablet Take 2 tablets (1,000 mg total) by mouth every 6 (six) hours as needed for moderate pain or fever. 10/16/21   Carlisle BeersStanhope, Telesha Deguzman M, FNP  Ascorbic Acid (VITAMIN C) 1000 MG tablet Take 1,000 mg by mouth  daily. Patient not taking: Reported on 11/26/2020    [provider]  benzonatate (TESSALON) 100 MG capsule Take 1-2 capsules (100-200 mg total) by mouth 3 (three) times daily as needed for cough. 01/23/21   Wallis Bamberg, PA-C  cetirizine (ZYRTEC) 10 MG tablet Take 1 tablet (10 mg total) by mouth daily. 05/29/21 06/28/21  Leath-Warren, Sadie Haber, NP  Cyanocobalamin (VITAMIN B12) 3000 MCG/ML LIQD Place under the tongue. Patient not taking: Reported on 11/26/2020    [provider]  cyclobenzaprine (FLEXERIL) 10 MG tablet Take 10 mg by mouth 3 (three) times daily. Patient not taking: Reported on 11/26/2020 05/07/20   [provider]  cyclobenzaprine (FLEXERIL) 5 MG tablet Take 1 tablet (5 mg total) by mouth at bedtime. Patient not taking: Reported on 11/26/2020 06/24/20   Particia Nearing, PA-C  diclofenac Sodium (VOLTAREN) 1 % GEL Apply 2 g topically 4 (four) times daily. 06/13/20   Merrilee Jansky, MD  ELDERBERRY PO Take 1 tablet by mouth daily.    [provider]  escitalopram (LEXAPRO) 10 MG tablet escitalopram 10 mg tablet  TAKE 1 TABLET BY MOUTH EVERY DAY Patient not taking: Reported on 11/26/2020    [provider]  fluticasone (FLONASE) 50 MCG/ACT nasal spray Place 2 sprays into both nostrils daily for 14 days. 05/29/21 06/12/21  Leath-Warren, Sadie Haber, NP  gabapentin (NEURONTIN) 300 MG capsule Take 300 mg by mouth 3 (three) times daily. 05/07/20   [provider]  hydrochlorothiazide (HYDRODIURIL) 25 MG tablet Take 25 mg by mouth daily. Patient not taking: Reported on 11/26/2020 05/07/20   [provider]  HYDROcodone-acetaminophen (NORCO) 5-325 MG tablet Take 1 tablet by mouth every 6 (six) hours as needed for moderate pain. Patient not taking: Reported on 11/26/2020 06/25/19   Elvina Sidle, MD  ibuprofen (ADVIL) 600 MG tablet Take 1 tablet (600 mg total) by mouth every 6 (six) hours as needed. 10/16/21   Carlisle Beers, FNP  Multiple Vitamin (MULTIVITAMIN WITH MINERALS) TABS tablet Take 1 tablet by mouth daily.    [provider]  oseltamivir (TAMIFLU) 75 MG capsule Take 1 capsule (75 mg total) by mouth 2 (two) times daily. 01/23/21   Wallis Bamberg, PA-C  pantoprazole (PROTONIX) 20 MG tablet Take 1 tablet (20 mg total) by mouth daily. 01/22/19   Milagros Loll, MD  phentermine 37.5 MG capsule phentermine 37.5 mg capsule  TAKE 1 CAPSULE BY MOUTH EVERY DAY Patient not taking: Reported on 11/26/2020    [provider]  predniSONE (DELTASONE) 20 MG tablet Take 2 tablets (40 mg total) by mouth daily with breakfast. Patient not taking: Reported on 11/26/2020 06/24/20   Particia Nearing, PA-C  pseudoephedrine (SUDAFED) 60 MG tablet Take 1 tablet (60 mg total) by mouth every 8 (eight) hours as needed  for congestion. 01/23/21   Wallis Bamberg, PA-C    Family History Family History  Problem Relation Age of Onset   Hypertension Mother    Sleep apnea Father    Diabetes Maternal Grandmother    Heart disease Maternal Grandmother    Hypertension Maternal Grandmother    Gout Maternal Grandmother    Stroke Paternal Grandmother     Social History Social History   Tobacco Use   Smoking status: Some Days    Types: Cigarettes   Smokeless tobacco: Never  Vaping Use   Vaping Use: Never used  Substance Use Topics   Alcohol use: No   Drug use: No  Allergies   Morphine and related   Review of Systems Review of Systems  HENT:  Positive for ear pain.   Respiratory:  Positive for cough.   Per HPI   Physical Exam Triage Vital Signs ED Triage Vitals  Enc Vitals Group     BP 10/16/21 1256 120/79     Pulse Rate 10/16/21 1256 82     Resp 10/16/21 1256 18     Temp 10/16/21 1256 97.7 F (36.5 C)     Temp Source 10/16/21 1256 Oral     SpO2 10/16/21 1256 96 %     Weight --      Height --      Head Circumference --      Peak Flow --      Pain Score 10/16/21 1255 7     Pain Loc --      Pain Edu? --      Excl. in GC? --    No data found.  Updated Vital Signs BP 120/79 (BP Location: Left Arm)   Pulse 82   Temp 97.7 F (36.5 C) (Oral)   Resp 18   SpO2 96%   Visual Acuity Right Eye Distance:   Left Eye Distance:   Bilateral Distance:    Right Eye Near:   Left Eye Near:    Bilateral Near:     Physical Exam Vitals and nursing note reviewed.  Constitutional:      Appearance: Normal appearance. She is obese. She is ill-appearing. She is not toxic-appearing.     Comments: Very pleasant patient sitting on exam in position of comfort table in no acute distress.   HENT:     Head: Normocephalic and atraumatic.     Right Ear: Hearing, tympanic membrane, ear canal and external ear normal.     Left Ear: Hearing, tympanic membrane, ear canal and external ear normal.      Nose: Congestion present.     Mouth/Throat:     Lips: Pink.     Mouth: Mucous membranes are moist.     Pharynx: Posterior oropharyngeal erythema present.     Tonsils: No tonsillar exudate. 1+ on the right. 1+ on the left.     Comments: Mild erythema to posterior oropharynx with small amount of clear postnasal drainage visualized. Airway intact and patent. Eyes:     General: Lids are normal. Vision grossly intact. Gaze aligned appropriately.     Extraocular Movements: Extraocular movements intact.     Conjunctiva/sclera: Conjunctivae normal.  Cardiovascular:     Rate and Rhythm: Normal rate and regular rhythm.     Heart sounds: Normal heart sounds, S1 normal and S2 normal.  Pulmonary:     Effort: Pulmonary effort is normal. No respiratory distress.     Breath sounds: Normal breath sounds and air entry.     Comments: Clear to auscultation bilaterally. Abdominal:     Palpations: Abdomen is soft.  Musculoskeletal:     Cervical back: Neck supple.  Lymphadenopathy:     Cervical: Cervical adenopathy present.  Skin:    General: Skin is warm and dry.     Capillary Refill: Capillary refill takes less than 2 seconds.     Findings: No rash.  Neurological:     General: No focal deficit present.     Mental Status: She is alert and oriented to person, place, and time. Mental status is at baseline.     Cranial Nerves: No dysarthria or facial asymmetry.     Gait:  Gait is intact.  Psychiatric:        Mood and Affect: Mood normal.        Speech: Speech normal.        Behavior: Behavior normal.        Thought Content: Thought content normal.        Judgment: Judgment normal.      UC Treatments / Results  Labs (all labs ordered are listed, but only abnormal results are displayed) Labs Reviewed  SARS CORONAVIRUS 2 BY RT PCR    EKG   Radiology No results found.  Procedures Procedures (including critical care time)  Medications Ordered in UC Medications  ibuprofen (ADVIL) tablet  800 mg (has no administration in time range)    Initial Impression / Assessment and Plan / UC Course  I have reviewed the triage vital signs and the nursing notes.  Pertinent labs & imaging results that were available during my care of the patient were reviewed by me and considered in my medical decision making (see chart for details).  Viral URI with cough Symptomology and physical exam are consistent with viral upper respiratory infection with cough.  Suspect patient may have COVID-19.  COVID-19 testing is pending.  She will need to quarantine for the next 2 days if her test is positive and wear a mask for the next 7 days in public to prevent spread of [id number] virus to others.   We will manage this with supportive care prescriptions for symptomatic relief.  Deferred imaging based on stable cardiopulmonary exam and hemodynamically stable vital signs at this time.  Guaifenesin twice daily to thin mucus prescribed.  Encourage patient to increase water intake while taking this medication and while recovering from viral illness.  Promethazine DM prescribed for cough at nighttime and patient advised to not drink or drive while taking this medicine as it can make her sleepy.  Tylenol 1000 mg and ibuprofen 600 mg every 6 hours to be taken with food for fever/chills, sore throat, aches and pains, and fever/chills.  Patient given 800 mg of ibuprofen in the clinic today to treat sore throat and generalized body aches.  Next dose of ibuprofen may be given tonight at 930 due to administration in the clinic today.  Nonpharmacologic methods of sore throat relief provided and after visit summary.  Patient is agreeable with this plan.  Discussed physical exam and available lab work findings in clinic with patient.  Counseled patient regarding appropriate use of medications and potential side effects for all medications recommended or prescribed today. Discussed red flag signs and symptoms of worsening condition,when  to call the PCP office, return to urgent care, and when to seek higher level of care in the emergency department. Patient verbalizes understanding and agreement with plan. All questions answered. Patient discharged in stable condition.  Final Clinical Impressions(s) / UC Diagnoses   Final diagnoses:  Viral URI with cough  Sore throat     Discharge Instructions      You have a viral upper respiratory infection.  COVID-19 testing is pending.  We will call you if your result was positive.  If you have a positive result, please stay at home for the next 2 days.  After 2 days, you may go out in the public but you must wear a mask for 5 more days to prevent spread of COVID-19 to others.  Wash your hands frequently.   Take guaifenesin 1200mg   2 times daily to thin your mucous so that you  can cough it up and blow it out of your nose easier. Drink plenty of water while taking this medication so that it works well in your body (at least 8 cups a day).   Take Promethazine DM cough medication to help with your cough at nighttime so that you are able to sleep. Do not drive, drink alcohol, or go to work while taking this medication since it can make you sleepy. Only take this at nighttime.   You may take tylenol 1,000mg  and ibuprofen 600mg  every 6 hours with food as needed for fever/chills, sore throat, aches/pains, and inflammation associated with viral illness. Take this with food to avoid stomach upset.    Your next dose of tylenol may be when you get home.  Your next dose of ibuprofen may be at 9:30 tonight since you were  given some in the clinic today.   You may do salt water and baking soda gargles every 4 hours as needed for your throat pain.  Please put 1 teaspoon of salt and 1/2 teaspoon of baking soda in 8 ounces of warm water then gargle and spit the water out. You may also put 1 tablespoon of honey in warm water and drink this to soothe your throat.  Place a humidifier in your room at night  to help decrease dry air that can irritate your airway and cause you to have a sore throat and cough.  Please try to eat a well-balanced diet while you are sick so that your body gets proper nutrition to heal.  If you develop any new or worsening symptoms, please return.  If your symptoms are severe, please go to the emergency room.  Follow-up with your primary care provider for further evaluation and management of your symptoms as well as ongoing wellness visits.  I hope you feel better!      ED Prescriptions     Medication Sig Dispense Auth. Provider   Guaifenesin 1200 MG TB12 Take 1 tablet (1,200 mg total) by mouth in the morning and at bedtime. 14 tablet , FNP   promethazine-dextromethorphan (PROMETHAZINE-DM) 6.25-15 MG/5ML syrup Take 5 mLs by mouth at bedtime as needed for cough. 118 mL 05-11-1988 M, FNP   acetaminophen (TYLENOL) 500 MG tablet Take 2 tablets (1,000 mg total) by mouth every 6 (six) hours as needed for moderate pain or fever. 30 tablet M M, FNP   ibuprofen (ADVIL) 600 MG tablet Take 1 tablet (600 mg total) by mouth every 6 (six) hours as needed. 30 tablet M, FNP      PDMP not reviewed this encounter.   Carlisle Beers, Carlisle Beers 10/16/21 1336

## 2021-10-18 ENCOUNTER — Telehealth (HOSPITAL_COMMUNITY): Payer: Self-pay | Admitting: Emergency Medicine

## 2021-10-19 LAB — SARS CORONAVIRUS 2 (TAT 6-24 HRS): SARS Coronavirus 2: NEGATIVE

## 2021-12-03 ENCOUNTER — Emergency Department
Admission: EM | Admit: 2021-12-03 | Discharge: 2021-12-03 | Disposition: A | Discharge status: Home / Self Care | Payer: Commercial Managed Care - PPO | Attending: Emergency Medicine | Admitting: Emergency Medicine

## 2021-12-03 ENCOUNTER — Ambulatory Visit (HOSPITAL_COMMUNITY)
Admission: EM | Admit: 2021-12-03 | Discharge: 2021-12-03 | Discharge status: Absent without leave | Payer: Commercial Managed Care - PPO

## 2021-12-03 ENCOUNTER — Emergency Department: Payer: Commercial Managed Care - PPO

## 2021-12-03 ENCOUNTER — Encounter: Payer: Self-pay | Admitting: Emergency Medicine

## 2021-12-03 ENCOUNTER — Other Ambulatory Visit: Payer: Self-pay

## 2021-12-03 DIAGNOSIS — R55 Syncope and collapse: Secondary | ICD-10-CM | POA: Diagnosis present

## 2021-12-03 DIAGNOSIS — W19XXXA Unspecified fall, initial encounter: Secondary | ICD-10-CM | POA: Diagnosis not present

## 2021-12-03 LAB — CBC
HCT: 36.9 % (ref 36.0–46.0)
Hemoglobin: 11.3 g/dL — ABNORMAL LOW (ref 12.0–15.0)
MCH: 23.3 pg — ABNORMAL LOW (ref 26.0–34.0)
MCHC: 30.6 g/dL (ref 30.0–36.0)
MCV: 75.9 fL — ABNORMAL LOW (ref 80.0–100.0)
Platelets: 377 10*3/uL (ref 150–400)
RBC: 4.86 MIL/uL (ref 3.87–5.11)
RDW: 15.4 % (ref 11.5–15.5)
WBC: 6.2 10*3/uL (ref 4.0–10.5)
nRBC: 0 % (ref 0.0–0.2)

## 2021-12-03 LAB — URINALYSIS, ROUTINE W REFLEX MICROSCOPIC
Bilirubin Urine: NEGATIVE
Glucose, UA: NEGATIVE mg/dL
Hgb urine dipstick: NEGATIVE
Ketones, ur: NEGATIVE mg/dL
Leukocytes,Ua: NEGATIVE
Nitrite: NEGATIVE
Protein, ur: NEGATIVE mg/dL
Specific Gravity, Urine: 1.015 (ref 1.005–1.030)
pH: 6 (ref 5.0–8.0)

## 2021-12-03 LAB — BASIC METABOLIC PANEL
Anion gap: 8 (ref 5–15)
BUN: 11 mg/dL (ref 6–20)
CO2: 28 mmol/L (ref 22–32)
Calcium: 8.8 mg/dL — ABNORMAL LOW (ref 8.9–10.3)
Chloride: 102 mmol/L (ref 98–111)
Creatinine, Ser: 0.73 mg/dL (ref 0.44–1.00)
GFR, Estimated: >60 mL/min (ref >60)
Glucose, Bld: 90 mg/dL (ref 70–99)
Potassium: 3.2 mmol/L — ABNORMAL LOW (ref 3.5–5.1)
Sodium: 138 mmol/L (ref 135–145)

## 2021-12-03 LAB — POC URINE PREG, ED: Preg Test, Ur: NEGATIVE

## 2021-12-03 NOTE — Discharge Instructions (Addendum)
Follow-up with your regular doctor if not improving to 3 days.  Return if worsening.  Drink plenty of fluids.  If you feel faint or lightheaded you should follow-up with your doctor or return here immediately.
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
## 2021-12-31 ENCOUNTER — Encounter (HOSPITAL_COMMUNITY): Payer: Self-pay

## 2021-12-31 ENCOUNTER — Ambulatory Visit (HOSPITAL_COMMUNITY)
Admission: EM | Admit: 2021-12-31 | Discharge: 2021-12-31 | Disposition: A | Discharge status: Home / Self Care | Payer: Commercial Managed Care - PPO | Attending: Physician Assistant | Admitting: Physician Assistant

## 2021-12-31 DIAGNOSIS — N760 Acute vaginitis: Secondary | ICD-10-CM | POA: Insufficient documentation

## 2021-12-31 MED ORDER — FLUCONAZOLE 150 MG PO TABS: 150.0000 mg | ORAL_TABLET | ORAL | 0 refills | Status: DC | PRN

## 2021-12-31 NOTE — ED Triage Notes (Signed)
Patient states about 2 weeks ago she used a new soap and has had a vaginal irritation since. States it is itching badly. States she tried using monistat and it didn't help.

## 2021-12-31 NOTE — ED Provider Notes (Signed)
[facility]    CSN: [id number] Arrival date & time: 12/31/21  0854      History   Chief Complaint Chief Complaint  Patient presents with   Vaginal Itching    HPI Victoria Rivers is a 46 y.o. female.   Patient presents today with a 2-week history of vaginal irritation.  Reports she has had some discharge but this is minimal.  She reports intense itching/burning sensation deep in the vagina.  Does report that she changed her soaps prior to symptoms beginning and believes this triggered episode.  She has tried over-the-counter boric acid as well as Monistat with improvement but not resolution of symptoms.  Denies history of diabetes and does not take SGLT2 inhibitor.  Denies any pelvic pain, abdominal pain, fever, nausea, vomiting, urinary symptoms.  She is confident she is not pregnant as she just got off her menstrual cycle.    Past Medical History:  Diagnosis Date   Anxiety    no meds   BV (bacterial vaginosis)    Chlamydia    Gonorrhea    Hypertension    Pregnancy, ectopic     Patient Active Problem List   Diagnosis Date Noted   Acute viral bronchitis 05/05/2014   Sinus tachycardia 05/05/2014   Lactic acidosis 05/05/2014   Nausea and vomiting 05/05/2014   Hypokalemia 05/05/2014   Influenza with respiratory manifestations     Past Surgical History:  Procedure Laterality Date   DILATION AND CURETTAGE OF UTERUS     ECTOPIC PREGNANCY SURGERY     pt denies, states only received MTX   THERAPEUTIC ABORTION      OB History     Gravida  8   Para  4   Term  4   Preterm      AB  3   Living  4      SAB      IAB  2   Ectopic  1   Multiple      Live Births  4        Obstetric Comments  MTX for ectopic          Home Medications    Prior to Admission medications   Medication Sig Start Date End Date Taking? Authorizing Provider  fluconazole (DIFLUCAN) 150 MG tablet Take 1 tablet (150 mg total) by mouth every 3 (three) days as  needed for up to 3 doses. 12/31/21  Yes Kaori Jumper, Noberto Retort, PA-C  acetaminophen (TYLENOL) 500 MG tablet Take 2 tablets (1,000 mg total) by mouth every 6 (six) hours as needed for moderate pain or fever. 10/16/21   Carlisle Beers, FNP  Ascorbic Acid (VITAMIN C) 1000 MG tablet Take 1,000 mg by mouth daily. Patient not taking: Reported on 11/26/2020    [provider]  benzonatate (TESSALON) 100 MG capsule Take 1-2 capsules (100-200 mg total) by mouth 3 (three) times daily as needed for cough. 01/23/21   Wallis Bamberg, PA-C  cetirizine (ZYRTEC) 10 MG tablet Take 1 tablet (10 mg total) by mouth daily. 05/29/21 06/28/21  Leath-Warren, Sadie Haber, NP  Cyanocobalamin (VITAMIN B12) 3000 MCG/ML LIQD Place under the tongue. Patient not taking: Reported on 11/26/2020    [provider]  diclofenac Sodium (VOLTAREN) 1 % GEL Apply 2 g topically 4 (four) times daily. 06/13/20   Merrilee Jansky, MD  ELDERBERRY PO Take 1 tablet by mouth daily.    [provider]  escitalopram (LEXAPRO) 10 MG tablet escitalopram 10 mg  tablet  TAKE 1 TABLET BY MOUTH EVERY DAY Patient not taking: Reported on 11/26/2020    [provider]  fluticasone (FLONASE) 50 MCG/ACT nasal spray Place 2 sprays into both nostrils daily for 14 days. 05/29/21 06/12/21  Leath-Warren, Sadie Haber, NP  gabapentin (NEURONTIN) 300 MG capsule Take 300 mg by mouth 3 (three) times daily. 05/07/20   [provider]  Guaifenesin 1200 MG TB12 Take 1 tablet (1,200 mg total) by mouth in the morning and at bedtime. 10/16/21   Carlisle Beers, FNP  hydrochlorothiazide (HYDRODIURIL) 25 MG tablet Take 25 mg by mouth daily. Patient not taking: Reported on 11/26/2020 05/07/20   [provider]  ibuprofen (ADVIL) 600 MG tablet Take 1 tablet (600 mg total) by mouth every 6 (six) hours as needed. 10/16/21   Carlisle Beers, FNP  Multiple Vitamin (MULTIVITAMIN WITH MINERALS) TABS tablet Take 1 tablet by mouth daily.     [provider]  pantoprazole (PROTONIX) 20 MG tablet Take 1 tablet (20 mg total) by mouth daily. 01/22/19   Milagros Loll, MD  phentermine 37.5 MG capsule phentermine 37.5 mg capsule  TAKE 1 CAPSULE BY MOUTH EVERY DAY Patient not taking: Reported on 11/26/2020    [provider]  predniSONE (DELTASONE) 20 MG tablet Take 2 tablets (40 mg total) by mouth daily with breakfast. Patient not taking: Reported on 11/26/2020 06/24/20   Particia Nearing, PA-C  promethazine-dextromethorphan (PROMETHAZINE-DM) 6.25-15 MG/5ML syrup Take 5 mLs by mouth at bedtime as needed for cough. 10/16/21   Carlisle Beers, FNP  pseudoephedrine (SUDAFED) 60 MG tablet Take 1 tablet (60 mg total) by mouth every 8 (eight) hours as needed for congestion. 01/23/21   Wallis Bamberg, PA-C    Family History Family History  Problem Relation Age of Onset   Hypertension Mother    Sleep apnea Father    Diabetes Maternal Grandmother    Heart disease Maternal Grandmother    Hypertension Maternal Grandmother    Gout Maternal Grandmother    Stroke Paternal Grandmother     Social History Social History   Tobacco Use   Smoking status: Some Days    Types: Cigarettes   Smokeless tobacco: Never  Vaping Use   Vaping Use: Never used  Substance Use Topics   Alcohol use: No   Drug use: No     Allergies   Morphine and related   Review of Systems Review of Systems  Constitutional:  Positive for activity change. Negative for appetite change, fatigue and fever.  Respiratory:  Negative for cough and shortness of breath.   Cardiovascular:  Negative for chest pain.  Gastrointestinal:  Negative for abdominal pain, diarrhea, nausea and vomiting.  Genitourinary:  Positive for vaginal pain (Irritation). Negative for dysuria, frequency, pelvic pain, urgency, vaginal bleeding and vaginal discharge.     Physical Exam Triage Vital Signs ED Triage Vitals [12/31/21 0931]  Enc Vitals Group     BP  136/82     Pulse Rate 92     Resp 16     Temp 98.2 F (36.8 C)     Temp Source Oral     SpO2 95 %     Weight      Height      Head Circumference      Peak Flow      Pain Score 0     Pain Loc      Pain Edu?      Excl. in GC?  No data found.  Updated Vital Signs BP 136/82 (BP Location: Left Arm)   Pulse 92   Temp 98.2 F (36.8 C) (Oral)   Resp 16   LMP 12/26/2021 (Exact Date)   SpO2 95%   Visual Acuity Right Eye Distance:   Left Eye Distance:   Bilateral Distance:    Right Eye Near:   Left Eye Near:    Bilateral Near:     Physical Exam Vitals reviewed.  Constitutional:      General: She is awake. She is not in acute distress.    Appearance: Normal appearance. She is well-developed. She is not ill-appearing.     Comments: Very pleasant female appears stated age in no acute distress sitting comfortably in exam room  HENT:     Head: Normocephalic and atraumatic.  Cardiovascular:     Rate and Rhythm: Normal rate and regular rhythm.     Heart sounds: Normal heart sounds, S1 normal and S2 normal. No murmur heard. Pulmonary:     Effort: Pulmonary effort is normal.     Breath sounds: Normal breath sounds. No wheezing, rhonchi or rales.     Comments: Clear to auscultation bilaterally Abdominal:     General: Bowel sounds are normal.     Palpations: Abdomen is soft.     Tenderness: There is no abdominal tenderness. There is no right CVA tenderness, left CVA tenderness, guarding or rebound.     Comments: Benign abdominal exam  Psychiatric:        Behavior: Behavior is cooperative.      UC Treatments / Results  Labs (all labs ordered are listed, but only abnormal results are displayed) Labs Reviewed  CERVICOVAGINAL ANCILLARY ONLY    EKG   Radiology No results found.  Procedures Procedures (including critical care time)  Medications Ordered in UC Medications - No data to display  Initial Impression / Assessment and Plan / UC Course  I have reviewed  the triage vital signs and the nursing notes.  Pertinent labs & imaging results that were available during my care of the patient were reviewed by me and considered in my medical decision making (see chart for details).     Patient is well-appearing, afebrile, nontoxic, nontachycardic.  No indication for emergent evaluation or imaging.  Concern for vaginal yeast infection given clinical presentation.  Patient was started on Diflucan every 3 days for up to 3 doses.  Discussed that she is to wear loosefitting cotton underwear and use hypoallergenic soaps and detergents.  STI swab was collected today-results pending.  We will contact her if we need to arrange any additional treatment.  Discussed that if she has any worsening symptoms including persistent discomfort, significant discharge, fever, nausea, vomiting, pelvic pain she should be seen immediately.  Strict return precautions given.  Final Clinical Impressions(s) / UC Diagnoses   Final diagnoses:  Acute vaginitis     Discharge Instructions      We are going to treat you for a vaginal yeast infection.  Please take Diflucan every 3 days for up to 3 doses.  Wear loosefitting cotton underwear and use hypoallergenic soaps and detergents.  We will contact you if we need to arrange any additional treatment.  If anything worsens please return including pelvic pain, nausea, vomiting, abdominal pain, fever.     ED Prescriptions     Medication Sig Dispense Auth. Provider   fluconazole (DIFLUCAN) 150 MG tablet Take 1 tablet (150 mg total) by mouth every 3 (three) days as  needed for up to 3 doses. 3 tablet Corrin Sieling, Noberto Retort, PA-C      PDMP not reviewed this encounter.   Jeani Hawking, PA-C 12/31/21 1040

## 2021-12-31 NOTE — Discharge Instructions (Signed)
We are going to treat you for a vaginal yeast infection.  Please take Diflucan every 3 days for up to 3 doses.  Wear loosefitting cotton underwear and use hypoallergenic soaps and detergents.  We will contact you if we need to arrange any additional treatment.  If anything worsens please return including pelvic pain, nausea, vomiting, abdominal pain, fever.

## 2022-01-01 ENCOUNTER — Telehealth: Payer: Self-pay | Admitting: Emergency Medicine

## 2022-01-01 ENCOUNTER — Telehealth (HOSPITAL_COMMUNITY): Payer: Self-pay | Admitting: Emergency Medicine

## 2022-01-01 LAB — CERVICOVAGINAL ANCILLARY ONLY
Bacterial Vaginitis (gardnerella): POSITIVE — AB
Candida Glabrata: NEGATIVE
Candida Vaginitis: NEGATIVE
Chlamydia: NEGATIVE
Comment: NEGATIVE
Comment: NEGATIVE
Comment: NEGATIVE
Comment: NEGATIVE
Comment: NEGATIVE
Comment: NORMAL
Neisseria Gonorrhea: NEGATIVE
Trichomonas: POSITIVE — AB

## 2022-01-01 MED ORDER — METRONIDAZOLE 500 MG PO TABS: 500.0000 mg | ORAL_TABLET | Freq: Two times a day (BID) | ORAL | 0 refills | Status: DC

## 2022-01-01 NOTE — Telephone Encounter (Signed)
Opened in error

## 2022-02-06 ENCOUNTER — Telehealth (HOSPITAL_COMMUNITY): Payer: Self-pay

## 2022-02-06 NOTE — Telephone Encounter (Signed)
Pt called with a question about a prescription from her last visit. Last visit was 12/31/2021. Placed a call back to pt and left a message.

## 2022-05-19 ENCOUNTER — Inpatient Hospital Stay (HOSPITAL_COMMUNITY)
Admission: AD | Admit: 2022-05-19 | Discharge: 2022-05-19 | Disposition: A | Discharge status: Home / Self Care | Payer: Commercial Managed Care - PPO | Attending: Family Medicine | Admitting: Family Medicine

## 2022-05-19 ENCOUNTER — Other Ambulatory Visit: Payer: Self-pay

## 2022-05-19 DIAGNOSIS — F17210 Nicotine dependence, cigarettes, uncomplicated: Secondary | ICD-10-CM | POA: Diagnosis not present

## 2022-05-19 DIAGNOSIS — N390 Urinary tract infection, site not specified: Secondary | ICD-10-CM | POA: Insufficient documentation

## 2022-05-19 DIAGNOSIS — N939 Abnormal uterine and vaginal bleeding, unspecified: Secondary | ICD-10-CM | POA: Diagnosis not present

## 2022-05-19 DIAGNOSIS — Z3200 Encounter for pregnancy test, result unknown: Secondary | ICD-10-CM

## 2022-05-19 DIAGNOSIS — I10 Essential (primary) hypertension: Secondary | ICD-10-CM | POA: Diagnosis not present

## 2022-05-19 DIAGNOSIS — Z79899 Other long term (current) drug therapy: Secondary | ICD-10-CM | POA: Insufficient documentation

## 2022-05-19 DIAGNOSIS — R1030 Lower abdominal pain, unspecified: Secondary | ICD-10-CM

## 2022-05-19 DIAGNOSIS — F419 Anxiety disorder, unspecified: Secondary | ICD-10-CM | POA: Insufficient documentation

## 2022-05-19 DIAGNOSIS — Z3202 Encounter for pregnancy test, result negative: Secondary | ICD-10-CM | POA: Diagnosis not present

## 2022-05-19 DIAGNOSIS — Z7952 Long term (current) use of systemic steroids: Secondary | ICD-10-CM | POA: Insufficient documentation

## 2022-05-19 DIAGNOSIS — Z791 Long term (current) use of non-steroidal anti-inflammatories (NSAID): Secondary | ICD-10-CM | POA: Diagnosis not present

## 2022-05-19 DIAGNOSIS — R5383 Other fatigue: Secondary | ICD-10-CM | POA: Insufficient documentation

## 2022-05-19 DIAGNOSIS — R109 Unspecified abdominal pain: Secondary | ICD-10-CM | POA: Diagnosis not present

## 2022-05-19 LAB — POCT PREGNANCY, URINE: Preg Test, Ur: NEGATIVE

## 2022-05-19 LAB — HCG, QUANTITATIVE, PREGNANCY: hCG, Beta Chain, Quant, S: <1 m[IU]/mL (ref <5)

## 2022-05-19 NOTE — MAU Provider Note (Signed)
MAU Provider Note  History  [id number]  Arrival date and time: 05/19/22 1745   No chief complaint on file.    HPI Victoria Rivers is a 47 y.o. [id number] who presents for confirmation of pregnancy.  She took a home pregnancy test which was positive.  She has been having lower pelvic pain that radiates up to her left flank.  She is gone to [facility] for this and received antibiotics for urinary tract infection.  She has been having some breast tenderness, fatigue and she knows this is typical of when she has been pregnant.  She has been having irregular periods for a few years now.  Her last period was January and it was really heavy.  In her prior history, she had very regular periods with no issues.  She had 1 episode of vaginal spotting 3 days ago, but this has decreased..   Vaginal bleeding: No LOF: No Fetal Movement: No Contractions: No     OB History     Gravida  8   Para  4   Term  4   Preterm      AB  3   Living  4      SAB      IAB  2   Ectopic  1   Multiple      Live Births  4        Obstetric Comments  MTX for ectopic         Past Medical History:  Diagnosis Date   Anxiety    no meds   BV (bacterial vaginosis)    Chlamydia    Gonorrhea    Hypertension    Pregnancy, ectopic     Past Surgical History:  Procedure Laterality Date   DILATION AND CURETTAGE OF UTERUS     ECTOPIC PREGNANCY SURGERY     pt denies, states only received MTX   THERAPEUTIC ABORTION      Family History  Problem Relation Age of Onset   Hypertension Mother    Sleep apnea Father    Diabetes Maternal Grandmother    Heart disease Maternal Grandmother    Hypertension Maternal Grandmother    Gout Maternal Grandmother    Stroke Paternal Grandmother     Social History   Socioeconomic History   Marital status: Single    Spouse name: Not on file   Number of children: Not on file   Years of education: Not on file   Highest education level: Not on file   Occupational History   Not on file  Tobacco Use   Smoking status: Some Days    Types: Cigarettes   Smokeless tobacco: Never  Vaping Use   Vaping Use: Never used  Substance and Sexual Activity   Alcohol use: No   Drug use: No   Sexual activity: Yes    Birth control/protection: Condom  Other Topics Concern   Not on file  Social History Narrative   Not on file   Social Determinants of Health   Financial Resource Strain: Not on file  Food Insecurity: Not on file  Transportation Needs: Not on file  Physical Activity: Not on file  Stress: Not on file  Social Connections: Not on file  Intimate Partner Violence: Not on file    Allergies  Allergen Reactions   Morphine And Related Rash    No current facility-administered medications on file prior to encounter.   Current Outpatient Medications on File Prior to Encounter  Medication Sig  Dispense Refill   acetaminophen (TYLENOL) 500 MG tablet Take 2 tablets (1,000 mg total) by mouth every 6 (six) hours as needed for moderate pain or fever. 30 tablet 1   Ascorbic Acid (VITAMIN C) 1000 MG tablet Take 1,000 mg by mouth daily. (Patient not taking: Reported on 11/26/2020)     benzonatate (TESSALON) 100 MG capsule Take 1-2 capsules (100-200 mg total) by mouth 3 (three) times daily as needed for cough. 60 capsule 0   cetirizine (ZYRTEC) 10 MG tablet Take 1 tablet (10 mg total) by mouth daily. 30 tablet 0   Cyanocobalamin (VITAMIN B12) 3000 MCG/ML LIQD Place under the tongue. (Patient not taking: Reported on 11/26/2020)     diclofenac Sodium (VOLTAREN) 1 % GEL Apply 2 g topically 4 (four) times daily. 100 g 0   ELDERBERRY PO Take 1 tablet by mouth daily.     escitalopram (LEXAPRO) 10 MG tablet escitalopram 10 mg tablet  TAKE 1 TABLET BY MOUTH EVERY DAY (Patient not taking: Reported on 11/26/2020)     fluconazole (DIFLUCAN) 150 MG tablet Take 1 tablet (150 mg total) by mouth every 3 (three) days as needed for up to 3 doses. 3 tablet 0    fluticasone (FLONASE) 50 MCG/ACT nasal spray Place 2 sprays into both nostrils daily for 14 days. 16 g 0   gabapentin (NEURONTIN) 300 MG capsule Take 300 mg by mouth 3 (three) times daily.     Guaifenesin 1200 MG TB12 Take 1 tablet (1,200 mg total) by mouth in the morning and at bedtime. 14 tablet 0   hydrochlorothiazide (HYDRODIURIL) 25 MG tablet Take 25 mg by mouth daily. (Patient not taking: Reported on 11/26/2020)     ibuprofen (ADVIL) 600 MG tablet Take 1 tablet (600 mg total) by mouth every 6 (six) hours as needed. 30 tablet 0   metroNIDAZOLE (FLAGYL) 500 MG tablet Take 1 tablet (500 mg total) by mouth 2 (two) times daily. 14 tablet 0   Multiple Vitamin (MULTIVITAMIN WITH MINERALS) TABS tablet Take 1 tablet by mouth daily.     pantoprazole (PROTONIX) 20 MG tablet Take 1 tablet (20 mg total) by mouth daily. 30 tablet 0   phentermine 37.5 MG capsule phentermine 37.5 mg capsule  TAKE 1 CAPSULE BY MOUTH EVERY DAY (Patient not taking: Reported on 11/26/2020)     predniSONE (DELTASONE) 20 MG tablet Take 2 tablets (40 mg total) by mouth daily with breakfast. (Patient not taking: Reported on 11/26/2020) 10 tablet 0   promethazine-dextromethorphan (PROMETHAZINE-DM) 6.25-15 MG/5ML syrup Take 5 mLs by mouth at bedtime as needed for cough. 118 mL 0   pseudoephedrine (SUDAFED) 60 MG tablet Take 1 tablet (60 mg total) by mouth every 8 (eight) hours as needed for congestion. 30 tablet 0     Review of Systems  Pertinent positives and negative per HPI, all others reviewed and negative  Physical Exam   BP 119/67 (BP Location: Right Arm)   Pulse 95   Temp 98.5 F (36.9 C) (Oral)   Resp 20   Ht '5\' 6"'$  (1.676 m)   Wt 111.3 kg   LMP  (Approximate) Comment: January 2024  SpO2 97%   BMI 39.59 kg/m   Patient Vitals for the past 24 hrs:  BP Temp Temp src Pulse Resp SpO2 Height Weight  05/19/22 1824 119/67 98.5 F (36.9 C) Oral 95 20 97 % -- --  05/19/22 1817 -- -- -- -- -- -- '5\' 6"'$  (1.676 m) 111.3 kg     Physical  Exam  Cervical Exam    Bedside Ultrasound not done  FHT Not applicable  Labs Results for orders placed or performed during the hospital encounter of 05/19/22 (from the past 24 hour(s))  Pregnancy, urine POC     Status: None   Collection Time: 05/19/22  6:12 PM  Result Value Ref Range   Preg Test, Ur NEGATIVE NEGATIVE  hCG, quantitative, pregnancy     Status: None   Collection Time: 05/19/22  6:36 PM  Result Value Ref Range   hCG, Beta Chain, Quant, S <1 <5 mIU/mL    Imaging No results found.  MAU Course  Procedures Lab Orders         Pregnancy, urine         hCG, quantitative, pregnancy         Pregnancy, urine POC    No orders of the defined types were placed in this encounter.  Imaging Orders  No imaging studies ordered today   MDM mild  Assessment and Plan  Abd pain Possible pregnancy  Patient here for evaluation of possible pregnancy.  She was recently diagnosed with UTI but she has other symptoms that she believes are related to pregnancy.  She had a positive pregnancy test at home. UPT and bhcg negative. Discussed with pt that she should compete her UTI abx and could follow up at our clinic for further evaluation of her sx. I do suspect perimenopause at this time.   Follow-up with primary OB.  Dispo: discharged to home in stable condition.   Discharge Instructions     Ambulatory referral to Obstetrics / Gynecology   Complete by: As directed    Discharge patient   Complete by: As directed    Discharge disposition: 01-Home or Self Care   Discharge patient date: 05/19/2022      Allergies as of 05/19/2022       Reactions   Morphine And Related Rash        Medication List     TAKE these medications    acetaminophen 500 MG tablet Commonly known as: TYLENOL Take 2 tablets (1,000 mg total) by mouth every 6 (six) hours as needed for moderate pain or fever.   benzonatate 100 MG capsule Commonly known as: TESSALON Take 1-2  capsules (100-200 mg total) by mouth 3 (three) times daily as needed for cough.   cetirizine 10 MG tablet Commonly known as: ZYRTEC Take 1 tablet (10 mg total) by mouth daily.   diclofenac Sodium 1 % Gel Commonly known as: Voltaren Apply 2 g topically 4 (four) times daily.   ELDERBERRY PO Take 1 tablet by mouth daily.   escitalopram 10 MG tablet Commonly known as: LEXAPRO escitalopram 10 mg tablet  TAKE 1 TABLET BY MOUTH EVERY DAY   fluconazole 150 MG tablet Commonly known as: Diflucan Take 1 tablet (150 mg total) by mouth every 3 (three) days as needed for up to 3 doses.   fluticasone 50 MCG/ACT nasal spray Commonly known as: FLONASE Place 2 sprays into both nostrils daily for 14 days.   gabapentin 300 MG capsule Commonly known as: NEURONTIN Take 300 mg by mouth 3 (three) times daily.   Guaifenesin 1200 MG Tb12 Take 1 tablet (1,200 mg total) by mouth in the morning and at bedtime.   hydrochlorothiazide 25 MG tablet Commonly known as: HYDRODIURIL Take 25 mg by mouth daily.   ibuprofen 600 MG tablet Commonly known as: ADVIL Take 1 tablet (600 mg total) by mouth every 6 (six)  hours as needed.   metroNIDAZOLE 500 MG tablet Commonly known as: FLAGYL Take 1 tablet (500 mg total) by mouth 2 (two) times daily.   multivitamin with minerals Tabs tablet Take 1 tablet by mouth daily.   pantoprazole 20 MG tablet Commonly known as: PROTONIX Take 1 tablet (20 mg total) by mouth daily.   phentermine 37.5 MG capsule phentermine 37.5 mg capsule  TAKE 1 CAPSULE BY MOUTH EVERY DAY   predniSONE 20 MG tablet Commonly known as: DELTASONE Take 2 tablets (40 mg total) by mouth daily with breakfast.   promethazine-dextromethorphan 6.25-15 MG/5ML syrup Commonly known as: PROMETHAZINE-DM Take 5 mLs by mouth at bedtime as needed for cough.   pseudoephedrine 60 MG tablet Commonly known as: SUDAFED Take 1 tablet (60 mg total) by mouth every 8 (eight) hours as needed for  congestion.   Vitamin B12 3000 MCG/ML Liqd Place under the tongue.   vitamin C 1000 MG tablet Take 1,000 mg by mouth daily.        Shelda Pal, [place] Fellow, Faculty practice [place] for [facility] 05/19/22  7:58 PM

## 2022-05-19 NOTE — Progress Notes (Signed)
Dr Dorena Cookey in earlier to see pt and discuss test results and d/c plan. Went in to give pt her d/c papers and pt had already left.

## 2022-05-19 NOTE — MAU Note (Signed)
Victoria Rivers is a 47 y.o. at Unknown here in MAU reporting: she had a +HPT 1 week and needs evaluation for an ectopic pregnancy.  Reports having pain in right lower back that began 3 days ago.  Denies current VB, had light VB 3 days ago. Reports seen in [place] on March 1st, diagnosed with UTI and given meds for treatment.  Reports completed meds as prescribed  LMP: January 2024 Onset of complaint: 3 days Pain score: 6 Vitals:   05/19/22 1824  BP: 119/67  Pulse: 95  Resp: 20  Temp: 98.5 F (36.9 C)  SpO2: 97%     FHT:NA Lab orders placed from triage:   UA

## 2022-08-14 ENCOUNTER — Ambulatory Visit (HOSPITAL_COMMUNITY)
Admission: EM | Admit: 2022-08-14 | Discharge: 2022-08-14 | Disposition: A | Discharge status: Home / Self Care | Payer: Commercial Managed Care - PPO

## 2022-08-14 ENCOUNTER — Encounter (HOSPITAL_COMMUNITY): Payer: Self-pay

## 2022-08-14 DIAGNOSIS — H6591 Unspecified nonsuppurative otitis media, right ear: Secondary | ICD-10-CM | POA: Diagnosis not present

## 2022-08-14 DIAGNOSIS — H60392 Other infective otitis externa, left ear: Secondary | ICD-10-CM | POA: Diagnosis not present

## 2022-08-14 MED ORDER — FLUTICASONE PROPIONATE 50 MCG/ACT NA SUSP: 1.0000 | Freq: Every day | NASAL | 0 refills | Status: DC

## 2022-08-14 MED ORDER — PREDNISONE 20 MG PO TABS: 40.0000 mg | ORAL_TABLET | Freq: Every day | ORAL | 0 refills | Status: DC

## 2022-08-14 MED ORDER — CIPROFLOXACIN-DEXAMETHASONE 0.3-0.1 % OT SUSP: 4.0000 [drp] | Freq: Two times a day (BID) | OTIC | 0 refills | Status: AC

## 2022-08-14 NOTE — ED Provider Notes (Signed)
[facility]    CSN: [id number] Arrival date & time: 08/14/22  1820      History   Chief Complaint Chief Complaint  Patient presents with   Ear Pain    HPI Victoria Rivers is a 47 y.o. female.   Pleasant 47 year old female presents today with a primary concern of a popping and pressure in her right ear primarily, present for several days now.  She states she slept with her earrings in yesterday, and feels like the back to one of her earrings is in her ear canal.  She tried to rinse it out at home, states this was ineffective.  She does admit to using Q-tips inside her ear canal to itch.  She denies any additional URI symptoms.  No fever.  No hearing loss.     Past Medical History:  Diagnosis Date   Anxiety    no meds   BV (bacterial vaginosis)    Chlamydia    Gonorrhea    Hypertension    Pregnancy, ectopic     Patient Active Problem List   Diagnosis Date Noted   Vaginal bleeding 05/19/2022   Acute viral bronchitis 05/05/2014   Sinus tachycardia 05/05/2014   Lactic acidosis 05/05/2014   Nausea and vomiting 05/05/2014   Hypokalemia 05/05/2014   Influenza with respiratory manifestations     Past Surgical History:  Procedure Laterality Date   DILATION AND CURETTAGE OF UTERUS     ECTOPIC PREGNANCY SURGERY     pt denies, states only received MTX   THERAPEUTIC ABORTION      OB History     Gravida  8   Para  4   Term  4   Preterm      AB  3   Living  4      SAB      IAB  2   Ectopic  1   Multiple      Live Births  4        Obstetric Comments  MTX for ectopic          Home Medications    Prior to Admission medications   Medication Sig Start Date End Date Taking? Authorizing Provider  ciprofloxacin-dexamethasone (CIPRODEX) OTIC suspension Place 4 drops into the left ear 2 (two) times daily for 7 days. 08/14/22 08/21/22 Yes Ayo Smoak L, PA  fluticasone (FLONASE) 50 MCG/ACT nasal spray Place 1 spray into both nostrils daily.  08/14/22  Yes Lugenia Assefa L, PA  gabapentin (NEURONTIN) 300 MG capsule Take 300 mg by mouth 3 (three) times daily. 05/07/20  Yes [provider]  acetaminophen (TYLENOL) 500 MG tablet Take 2 tablets (1,000 mg total) by mouth every 6 (six) hours as needed for moderate pain or fever. 10/16/21   Carlisle Beers, FNP  cetirizine (ZYRTEC) 10 MG tablet Take 1 tablet (10 mg total) by mouth daily. 05/29/21 06/28/21  Leath-Warren, Sadie Haber, NP  diclofenac Sodium (VOLTAREN) 1 % GEL Apply 2 g topically 4 (four) times daily. 06/13/20   Merrilee Jansky, MD  ELDERBERRY PO Take 1 tablet by mouth daily.    [provider]  fluconazole (DIFLUCAN) 150 MG tablet Take 1 tablet (150 mg total) by mouth every 3 (three) days as needed for up to 3 doses. 12/31/21   Raspet, Noberto Retort, PA-C  Guaifenesin 1200 MG TB12 Take 1 tablet (1,200 mg total) by mouth in the morning and at bedtime. 10/16/21   Carlisle Beers, FNP  hydrochlorothiazide (  MICROZIDE) 12.5 MG capsule Take by mouth.    [provider]  ibuprofen (ADVIL) 600 MG tablet Take 1 tablet (600 mg total) by mouth every 6 (six) hours as needed. 10/16/21   Carlisle Beers, FNP  metroNIDAZOLE (FLAGYL) 500 MG tablet Take 1 tablet (500 mg total) by mouth 2 (two) times daily. 01/01/22   LampteyBritta Mccreedy, MD  Multiple Vitamin (MULTIVITAMIN WITH MINERALS) TABS tablet Take 1 tablet by mouth daily.    [provider]  pantoprazole (PROTONIX) 20 MG tablet Take 1 tablet (20 mg total) by mouth daily. 01/22/19   Milagros Loll, MD  predniSONE (DELTASONE) 20 MG tablet Take 2 tablets (40 mg total) by mouth daily with breakfast. 08/14/22   Majestic Molony L, PA  promethazine-dextromethorphan (PROMETHAZINE-DM) 6.25-15 MG/5ML syrup Take 5 mLs by mouth at bedtime as needed for cough. 10/16/21   Carlisle Beers, FNP  pseudoephedrine (SUDAFED) 60 MG tablet Take 1 tablet (60 mg total) by mouth every 8 (eight) hours as needed for congestion.  01/23/21   Wallis Bamberg, PA-C    Family History Family History  Problem Relation Age of Onset   Hypertension Mother    Sleep apnea Father    Diabetes Maternal Grandmother    Heart disease Maternal Grandmother    Hypertension Maternal Grandmother    Gout Maternal Grandmother    Stroke Paternal Grandmother     Social History Social History   Tobacco Use   Smoking status: Some Days    Types: Cigarettes   Smokeless tobacco: Never  Vaping Use   Vaping Use: Never used  Substance Use Topics   Alcohol use: No   Drug use: No     Allergies   Morphine and codeine   Review of Systems Review of Systems As per HPI  Physical Exam Triage Vital Signs ED Triage Vitals [08/14/22 1842]  Enc Vitals Group     BP 124/84     Pulse Rate (!) 104     Resp 18     Temp 98.7 F (37.1 C)     Temp Source Oral     SpO2 96 %     Weight      Height      Head Circumference      Peak Flow      Pain Score      Pain Loc      Pain Edu?      Excl. in GC?    No data found.  Updated Vital Signs BP 124/84 (BP Location: Left Arm)   Pulse (!) 104   Temp 98.7 F (37.1 C) (Oral)   Resp 18   SpO2 96%   Visual Acuity Right Eye Distance:   Left Eye Distance:   Bilateral Distance:    Right Eye Near:   Left Eye Near:    Bilateral Near:     Physical Exam Vitals and nursing note reviewed. Exam conducted with a chaperone present.  Constitutional:      General: She is not in acute distress.    Appearance: Normal appearance. She is well-developed. She is obese. She is not ill-appearing, toxic-appearing or diaphoretic.  HENT:     Head: Normocephalic and atraumatic.     Jaw: There is normal jaw occlusion. No trismus, tenderness, swelling, pain on movement or malocclusion.     Salivary Glands: Right salivary gland is not diffusely enlarged or tender. Left salivary gland is not diffusely enlarged or tender.     Right Ear:  External ear normal. A middle ear effusion (clear fluid, air/fluid  levels) is present. There is no impacted cerumen. No foreign body. No hemotympanum. Tympanic membrane is not injected, scarred, perforated, erythematous or retracted.     Left Ear: Tympanic membrane, ear canal and external ear normal. Drainage and swelling (deep erythema to L EAC) present. There is no impacted cerumen. No foreign body.     Ears:     Comments: Unable to visualize FB to either EAC    Nose: Nose normal. No congestion or rhinorrhea.     Right Turbinates: Not enlarged or swollen.     Left Turbinates: Not enlarged or swollen.     Mouth/Throat:     Mouth: Mucous membranes are moist.     Pharynx: Oropharynx is clear. No oropharyngeal exudate or posterior oropharyngeal erythema.  Eyes:     General: No scleral icterus.       Right eye: No discharge.        Left eye: No discharge.     Extraocular Movements: Extraocular movements intact.     Conjunctiva/sclera: Conjunctivae normal.     Pupils: Pupils are equal, round, and reactive to light.  Cardiovascular:     Rate and Rhythm: Normal rate and regular rhythm.     Heart sounds: No murmur heard. Pulmonary:     Effort: Pulmonary effort is normal. No respiratory distress.     Breath sounds: Normal breath sounds.  Musculoskeletal:        General: No swelling.     Cervical back: Normal range of motion and neck supple. No rigidity or tenderness.  Lymphadenopathy:     Cervical: No cervical adenopathy.  Skin:    General: Skin is warm and dry.     Capillary Refill: Capillary refill takes less than 2 seconds.     Coloration: Skin is not jaundiced.     Findings: No bruising, erythema or rash.  Neurological:     General: No focal deficit present.     Mental Status: She is alert and oriented to person, place, and time.  Psychiatric:        Mood and Affect: Mood normal.      UC Treatments / Results  Labs (all labs ordered are listed, but only abnormal results are displayed) Labs Reviewed - No data to  display  EKG   Radiology No results found.  Procedures Ear Cerumen Removal  Date/Time: 08/14/2022 7:20 PM  Performed by: Maretta Bees, PA Authorized by: Maretta Bees, PA   Consent:    Consent obtained:  Verbal   Consent given by:  Patient   Risks, benefits, and alternatives were discussed: yes     Risks discussed:  Bleeding, dizziness, infection, incomplete removal, pain and TM perforation   Alternatives discussed:  No treatment, delayed treatment and alternative treatment Universal protocol:    Procedure explained and questions answered to patient or proxy's satisfaction: yes   Procedure details:    Location:  R ear   Procedure type: irrigation     Successful cerumen removal: attempted to remove a possible FB - nothing was recovered from [facility].   Post-procedure details:    Inspection:  Ear canal clear, no bleeding and TM intact   Hearing quality:  Normal   Procedure completion:  Tolerated well, no immediate complications Comments:     Ear lavage attempted to see if FB that was not visible on otoscope exam could be recovered from ear canal. There were no FBs noted after complete  ear flushing.  (including critical care time)  Medications Ordered in UC Medications - No data to display  Initial Impression / Assessment and Plan / UC Course  I have reviewed the triage vital signs and the nursing notes.  Pertinent labs & imaging results that were available during my care of the patient were reviewed by me and considered in my medical decision making (see chart for details).     OE L ear -stop using Q-tips.  This is likely the cause of the redness swelling and irritation on the left external ear canal.  Please use 4 drops of Ciprodex twice daily for 7 days. Middle ear effusion R -this is likely the cause of the pressure.  I do not see any foreign bodies in her ear canal, additionally flushing her ear canal did not reveal any retained foreign bodies either.  We will  therefore treat her ear effusion with prednisone and Flonase.   Final Clinical Impressions(s) / UC Diagnoses   Final diagnoses:  Other infective acute otitis externa of left ear  Middle ear effusion, right     Discharge Instructions      You have an external ear infection on the left.  Please apply the eardrops twice daily for 7 days.  Please take 2 tablets of prednisone every morning to help with the pressure and fluid on the right ear. Also use one spray in each nostril twice daily.     ED Prescriptions     Medication Sig Dispense Auth. Provider   predniSONE (DELTASONE) 20 MG tablet Take 2 tablets (40 mg total) by mouth daily with breakfast. 10 tablet Luma Clopper L, PA   ciprofloxacin-dexamethasone (CIPRODEX) OTIC suspension Place 4 drops into the left ear 2 (two) times daily for 7 days. 7.5 mL Zyan Mirkin L, PA   fluticasone (FLONASE) 50 MCG/ACT nasal spray Place 1 spray into both nostrils daily. 16 mL Rucker Pridgeon L, PA      PDMP not reviewed this encounter.   Maretta Bees, [place] 08/14/22 2047

## 2022-08-14 NOTE — Discharge Instructions (Addendum)
You have an external ear infection on the left.  Please apply the eardrops twice daily for 7 days.  Please take 2 tablets of prednisone every morning to help with the pressure and fluid on the right ear. Also use one spray in each nostril twice daily.

## 2022-08-14 NOTE — ED Triage Notes (Signed)
Pt reports right ear pain for several days. Pt reports some fullness.

## 2022-09-27 ENCOUNTER — Encounter (HOSPITAL_COMMUNITY): Payer: Self-pay

## 2022-09-27 ENCOUNTER — Ambulatory Visit (HOSPITAL_COMMUNITY)
Admission: EM | Admit: 2022-09-27 | Discharge: 2022-09-27 | Disposition: A | Discharge status: Home / Self Care | Payer: Commercial Managed Care - PPO | Attending: Internal Medicine | Admitting: Internal Medicine

## 2022-09-27 DIAGNOSIS — B9789 Other viral agents as the cause of diseases classified elsewhere: Secondary | ICD-10-CM | POA: Insufficient documentation

## 2022-09-27 DIAGNOSIS — F17210 Nicotine dependence, cigarettes, uncomplicated: Secondary | ICD-10-CM | POA: Diagnosis not present

## 2022-09-27 DIAGNOSIS — Z1152 Encounter for screening for COVID-19: Secondary | ICD-10-CM | POA: Diagnosis not present

## 2022-09-27 DIAGNOSIS — H9203 Otalgia, bilateral: Secondary | ICD-10-CM | POA: Insufficient documentation

## 2022-09-27 DIAGNOSIS — J069 Acute upper respiratory infection, unspecified: Secondary | ICD-10-CM | POA: Diagnosis not present

## 2022-09-27 MED ORDER — IBUPROFEN 800 MG PO TABS: 800.0000 mg | ORAL_TABLET | Freq: Three times a day (TID) | ORAL | 0 refills | Status: DC

## 2022-09-27 MED ORDER — GUAIFENESIN ER 1200 MG PO TB12: 1200.0000 mg | ORAL_TABLET | Freq: Two times a day (BID) | ORAL | 0 refills | Status: DC

## 2022-09-27 MED ORDER — BENZONATATE 100 MG PO CAPS: 100.0000 mg | ORAL_CAPSULE | Freq: Three times a day (TID) | ORAL | 0 refills | Status: DC

## 2022-09-27 NOTE — ED Provider Notes (Signed)
[facility]    CSN: [id number] Arrival date & time: 09/27/22  1601      History   Chief Complaint Chief Complaint  Patient presents with   Otalgia    Bilat    HPI Victoria Rivers is a 47 y.o. female.   Patient presents to urgent care for evaluation of bilateral ear pain, sore throat, dry cough, body aches, chills without known documented fever at home, and generalized fatigue that started 2 days ago on Friday, September 25, 2022.  No recent known sick contacts with similar symptoms.  Sore throat is worsened by swallowing.  Cough is dry, nonproductive, and worse at nighttime.  Body aches have not responded well to as needed use of Tylenol.  Last dose of Tylenol was last night.  She is a current cigarette smoker, denies other drug use.  No history of chronic respiratory problems.  No recent documented COVID-19 diagnosis in the last 90 days.  She has only been using Tylenol for symptoms without very much relief.   Otalgia   Past Medical History:  Diagnosis Date   Anxiety    no meds   BV (bacterial vaginosis)    Chlamydia    Gonorrhea    Hypertension    Pregnancy, ectopic     Patient Active Problem List   Diagnosis Date Noted   Vaginal bleeding 05/19/2022   Acute viral bronchitis 05/05/2014   Sinus tachycardia 05/05/2014   Lactic acidosis 05/05/2014   Nausea and vomiting 05/05/2014   Hypokalemia 05/05/2014   Influenza with respiratory manifestations     Past Surgical History:  Procedure Laterality Date   DILATION AND CURETTAGE OF UTERUS     ECTOPIC PREGNANCY SURGERY     pt denies, states only received MTX   THERAPEUTIC ABORTION      OB History     Gravida  8   Para  4   Term  4   Preterm      AB  3   Living  4      SAB      IAB  2   Ectopic  1   Multiple      Live Births  4        Obstetric Comments  MTX for ectopic          Home Medications    Prior to Admission medications   Medication Sig Start Date End Date Taking?  Authorizing Provider  acetaminophen (TYLENOL) 500 MG tablet Take 2 tablets (1,000 mg total) by mouth every 6 (six) hours as needed for moderate pain or fever. 10/16/21  Yes Carlisle Beers, FNP  benzonatate (TESSALON) 100 MG capsule Take 1 capsule (100 mg total) by mouth every 8 (eight) hours. 09/27/22  Yes Carlisle Beers, FNP  fluticasone (FLONASE) 50 MCG/ACT nasal spray Place 1 spray into both nostrils daily. 08/14/22  Yes Crain, Whitney L, PA  Guaifenesin 1200 MG TB12 Take 1 tablet (1,200 mg total) by mouth in the morning and at bedtime. 09/27/22  Yes Carlisle Beers, FNP  hydrochlorothiazide (MICROZIDE) 12.5 MG capsule Take by mouth.   Yes [provider]  ibuprofen (ADVIL) 800 MG tablet Take 1 tablet (800 mg total) by mouth 3 (three) times daily. 09/27/22  Yes Carlisle Beers, FNP  cetirizine (ZYRTEC) 10 MG tablet Take 1 tablet (10 mg total) by mouth daily. 05/29/21 06/28/21  Leath-Warren, Sadie Haber, NP  diclofenac Sodium (VOLTAREN) 1 % GEL Apply 2 g topically 4 (four)  times daily. 06/13/20   Merrilee Jansky, MD  ELDERBERRY PO Take 1 tablet by mouth daily.    [provider]  fluconazole (DIFLUCAN) 150 MG tablet Take 1 tablet (150 mg total) by mouth every 3 (three) days as needed for up to 3 doses. 12/31/21   Raspet, Noberto Retort, PA-C  gabapentin (NEURONTIN) 300 MG capsule Take 300 mg by mouth 3 (three) times daily. 05/07/20   [provider]  metroNIDAZOLE (FLAGYL) 500 MG tablet Take 1 tablet (500 mg total) by mouth 2 (two) times daily. 01/01/22   LampteyBritta Mccreedy, MD  Multiple Vitamin (MULTIVITAMIN WITH MINERALS) TABS tablet Take 1 tablet by mouth daily.    [provider]  pantoprazole (PROTONIX) 20 MG tablet Take 1 tablet (20 mg total) by mouth daily. 01/22/19   Milagros Loll, MD  predniSONE (DELTASONE) 20 MG tablet Take 2 tablets (40 mg total) by mouth daily with breakfast. 08/14/22   Crain, Whitney L, PA  promethazine-dextromethorphan  (PROMETHAZINE-DM) 6.25-15 MG/5ML syrup Take 5 mLs by mouth at bedtime as needed for cough. 10/16/21   Carlisle Beers, FNP  pseudoephedrine (SUDAFED) 60 MG tablet Take 1 tablet (60 mg total) by mouth every 8 (eight) hours as needed for congestion. 01/23/21   Wallis Bamberg, PA-C    Family History Family History  Problem Relation Age of Onset   Hypertension Mother    Sleep apnea Father    Diabetes Maternal Grandmother    Heart disease Maternal Grandmother    Hypertension Maternal Grandmother    Gout Maternal Grandmother    Stroke Paternal Grandmother     Social History Social History   Tobacco Use   Smoking status: Some Days    Types: Cigarettes   Smokeless tobacco: Never  Vaping Use   Vaping status: Never Used  Substance Use Topics   Alcohol use: No   Drug use: No     Allergies   Morphine and codeine   Review of Systems Review of Systems  HENT:  Positive for ear pain.   Per HPI   Physical Exam Triage Vital Signs ED Triage Vitals  Encounter Vitals Group     BP 09/27/22 1644 105/73     Systolic BP Percentile --      Diastolic BP Percentile --      Pulse Rate 09/27/22 1644 97     Resp 09/27/22 1644 16     Temp 09/27/22 1644 98.5 F (36.9 C)     Temp Source 09/27/22 1644 Oral     SpO2 09/27/22 1644 95 %     Weight 09/27/22 1643 245 lb (111.1 kg)     Height 09/27/22 1643 5\' 6"  (1.676 m)     Head Circumference --      Peak Flow --      Pain Score 09/27/22 1641 8     Pain Loc --      Pain Education --      Exclude from Growth Chart --    No data found.  Updated Vital Signs BP 105/73 (BP Location: Right Arm)   Pulse 97   Temp 98.5 F (36.9 C) (Oral)   Resp 16   Ht 5\' 6"  (1.676 m)   Wt 245 lb (111.1 kg)   LMP 05/04/2022 (Approximate)   SpO2 95%   Breastfeeding No   BMI 39.54 kg/m   Visual Acuity Right Eye Distance:   Left Eye Distance:   Bilateral Distance:    Right Eye Near:  Left Eye Near:    Bilateral Near:     Physical Exam Vitals  and nursing note reviewed.  Constitutional:      General: She is not in acute distress.    Appearance: She is obese. She is ill-appearing. She is not toxic-appearing.  HENT:     Head: Normocephalic and atraumatic.     Right Ear: Hearing, tympanic membrane, ear canal and external ear normal.     Left Ear: Hearing, tympanic membrane, ear canal and external ear normal.     Nose: Congestion present.     Mouth/Throat:     Lips: Pink.     Mouth: Mucous membranes are moist. No injury.     Tongue: No lesions. Tongue does not deviate from midline.     Palate: No mass and lesions.     Pharynx: Oropharynx is clear. Uvula midline. Posterior oropharyngeal erythema present. No pharyngeal swelling, oropharyngeal exudate or uvula swelling.     Tonsils: No tonsillar exudate or tonsillar abscesses.  Eyes:     General: Lids are normal. Vision grossly intact. Gaze aligned appropriately.        Right eye: No discharge.        Left eye: No discharge.     Extraocular Movements: Extraocular movements intact.     Conjunctiva/sclera: Conjunctivae normal.  Cardiovascular:     Rate and Rhythm: Normal rate and regular rhythm.     Heart sounds: Normal heart sounds, S1 normal and S2 normal.  Pulmonary:     Effort: Pulmonary effort is normal. No respiratory distress.     Breath sounds: Normal breath sounds and air entry.  Musculoskeletal:     Cervical back: Neck supple.  Lymphadenopathy:     Cervical: Cervical adenopathy present.  Skin:    General: Skin is warm and dry.     Capillary Refill: Capillary refill takes less than 2 seconds.     Findings: No rash.  Neurological:     General: No focal deficit present.     Mental Status: She is alert and oriented to person, place, and time. Mental status is at baseline.     Cranial Nerves: No dysarthria or facial asymmetry.  Psychiatric:        Mood and Affect: Mood normal.        Speech: Speech normal.        Behavior: Behavior normal.        Thought Content:  Thought content normal.        Judgment: Judgment normal.      UC Treatments / Results  Labs (all labs ordered are listed, but only abnormal results are displayed) Labs Reviewed  SARS CORONAVIRUS 2 (TAT 6-24 HRS)    EKG   Radiology No results found.  Procedures Procedures (including critical care time)  Medications Ordered in UC Medications - No data to display  Initial Impression / Assessment and Plan / UC Course  I have reviewed the triage vital signs and the nursing notes.  Pertinent labs & imaging results that were available during my care of the patient were reviewed by me and considered in my medical decision making (see chart for details).   1.  Viral URI with cough, otalgia of both ears Evaluation suggests viral URI etiology. Will manage this with recommendations for OTC and prescription medications for symptomatic relief. Encouraged to push fluids to stay well hydrated.  Imaging: deferred based on stable cardiopulmonary exam/hemodynamically stable vital signs Viral testing: COVID-19 testing is pending, patient may have  antiviral therapy should she test positive for COVID-19.  She is currently on day 3 of symptoms.  Counseled patient on potential for adverse effects with medications prescribed/recommended today, strict ER and return-to-clinic precautions discussed, patient verbalized understanding.    Final Clinical Impressions(s) / UC Diagnoses   Final diagnoses:  Viral URI with cough  Otalgia of both ears     Discharge Instructions      You have a viral upper respiratory infection.  COVID-19 testing is pending. We will call you with results if positive.  Use the following medicines to help with symptoms: - Plain Mucinex (guaifenesin) over the counter as directed every 12 hours to thin mucous so that you are able to get it out of your body easier. Drink plenty of water while taking this medication so that it works well in your body (at least 8 cups a  day).  - Tylenol 1,000mg  and/or ibuprofen 600mg  every 6 hours with food as needed for aches/pains or fever/chills.  - Tessalon perles every 8 hours as needed for cough.  1 tablespoon of honey in warm water and/or salt water gargles may also help with symptoms. Humidifier to your room will help add water to the air and reduce coughing.  If you develop any new or worsening symptoms, please return.  If your symptoms are severe, please go to the emergency room.  Follow-up with your primary care provider for further evaluation and management of your symptoms as well as ongoing wellness visits.  I hope you feel better!   ED Prescriptions     Medication Sig Dispense Auth. Provider   benzonatate (TESSALON) 100 MG capsule Take 1 capsule (100 mg total) by mouth every 8 (eight) hours. 21 capsule Reita May M, FNP   Guaifenesin 1200 MG TB12 Take 1 tablet (1,200 mg total) by mouth in the morning and at bedtime. 14 tablet Reita May M, FNP   ibuprofen (ADVIL) 800 MG tablet Take 1 tablet (800 mg total) by mouth 3 (three) times daily. 21 tablet Carlisle Beers, FNP      PDMP not reviewed this encounter.   Carlisle Beers, [place] 09/27/22 [phone]

## 2022-09-27 NOTE — ED Triage Notes (Signed)
Patient here today with c/o bilat ear pain X 2 days. She started having fatigue, chills, sweats, and body aches started yesterday. Patient here last month with ear pain. Patient took Tylenol last night with no relief.

## 2022-09-27 NOTE — Discharge Instructions (Addendum)
You have a viral upper respiratory infection.  COVID-19 testing is pending. We will call you with results if positive.  Use the following medicines to help with symptoms: - Plain Mucinex (guaifenesin) over the counter as directed every 12 hours to thin mucous so that you are able to get it out of your body easier. Drink plenty of water while taking this medication so that it works well in your body (at least 8 cups a day).  - Tylenol 1,000mg  and/or ibuprofen 600mg  every 6 hours with food as needed for aches/pains or fever/chills.  - Tessalon perles every 8 hours as needed for cough.  1 tablespoon of honey in warm water and/or salt water gargles may also help with symptoms. Humidifier to your room will help add water to the air and reduce coughing.  If you develop any new or worsening symptoms, please return.  If your symptoms are severe, please go to the emergency room.  Follow-up with your primary care provider for further evaluation and management of your symptoms as well as ongoing wellness visits.  I hope you feel better!

## 2022-09-28 LAB — SARS CORONAVIRUS 2 (TAT 6-24 HRS): SARS Coronavirus 2: NEGATIVE

## 2023-02-25 ENCOUNTER — Ambulatory Visit (HOSPITAL_COMMUNITY)
Admission: EM | Admit: 2023-02-25 | Discharge: 2023-02-25 | Disposition: A | Discharge status: Home / Self Care | Payer: Commercial Managed Care - PPO | Attending: Family Medicine | Admitting: Family Medicine

## 2023-02-25 ENCOUNTER — Encounter (HOSPITAL_COMMUNITY): Payer: Self-pay

## 2023-02-25 DIAGNOSIS — E860 Dehydration: Secondary | ICD-10-CM

## 2023-02-25 DIAGNOSIS — J069 Acute upper respiratory infection, unspecified: Secondary | ICD-10-CM

## 2023-02-25 NOTE — Discharge Instructions (Addendum)
 You have an upper respiratory infection. Most cases are due to a virus and do not require antibiotics for treatment.  Make sure to continue Dolin oral hydration.  You can take tylenol for fever as needed Start Flonase daily for the next week and then as needed. You can use nasal saline spray multiple times daily as well.  You can use a daily antihistamine or guaifenesin as an expectorant but you need to be well hydrated for these medications to work.  Get adequate rest for recovery Maintain distance from others and wear a mask in public areas to avoid spread  If you start to experience shortness of breath, fevers that don't respond to medication, confusion, profound neck stiffness, or fainting, return to the urgent care or ED.

## 2023-02-25 NOTE — ED Triage Notes (Addendum)
Pt c/o diarrhea, head congestion, sneezing, headache, and extreme fatigue x2 days. Took Therma flu with relief. States had a negative home covid test.

## 2023-02-25 NOTE — ED Provider Notes (Signed)
[facility]    CSN: [id number] Arrival date & time: 02/25/23  0845      History   Chief Complaint Chief Complaint  Patient presents with   Nasal Congestion    HPI Victoria Rivers is a 47 y.o. female.   Victoria Rivers presents with 2 days of sinus congestion, postnasal drip, and diarrhea that started after the congestion.  She has had a mild cough that is nonproductive but no sore throat, fever, chills, nausea, vomiting, rashes, joint pain.  She took TheraFlu last night which did help her.  This morning she felt very well and went to work and immediately started feeling worse again.  She did not have her flu shot this year.  In the last 3 days she has drank very little water (1 bottle a day)  The history is provided by the patient.    Past Medical History:  Diagnosis Date   Anxiety    no meds   BV (bacterial vaginosis)    Chlamydia    Gonorrhea    Hypertension    Pregnancy, ectopic     Patient Active Problem List   Diagnosis Date Noted   Vaginal bleeding 05/19/2022   Acute viral bronchitis 05/05/2014   Sinus tachycardia 05/05/2014   Lactic acidosis 05/05/2014   Nausea and vomiting 05/05/2014   Hypokalemia 05/05/2014   Influenza with respiratory manifestation     Past Surgical History:  Procedure Laterality Date   DILATION AND CURETTAGE OF UTERUS     ECTOPIC PREGNANCY SURGERY     pt denies, states only received MTX   THERAPEUTIC ABORTION      OB History     Gravida  8   Para  4   Term  4   Preterm      AB  3   Living  4      SAB      IAB  2   Ectopic  1   Multiple      Live Births  4        Obstetric Comments  MTX for ectopic          Home Medications    Prior to Admission medications   Medication Sig Start Date End Date Taking? Authorizing Provider  acetaminophen (TYLENOL) 500 MG tablet Take 2 tablets (1,000 mg total) by mouth every 6 (six) hours as needed for moderate pain or fever. 10/16/21   Carlisle Beers, FNP   cetirizine (ZYRTEC) 10 MG tablet Take 1 tablet (10 mg total) by mouth daily. 05/29/21 06/28/21  Leath-Warren, Sadie Haber, NP  diclofenac Sodium (VOLTAREN) 1 % GEL Apply 2 g topically 4 (four) times daily. 06/13/20   Merrilee Jansky, MD  ELDERBERRY PO Take 1 tablet by mouth daily.    [provider]  fluconazole (DIFLUCAN) 150 MG tablet Take 1 tablet (150 mg total) by mouth every 3 (three) days as needed for up to 3 doses. 12/31/21   Raspet, Erin K, PA-C  fluticasone (FLONASE) 50 MCG/ACT nasal spray Place 1 spray into both nostrils daily. 08/14/22   Crain, Whitney L, PA  gabapentin (NEURONTIN) 300 MG capsule Take 300 mg by mouth 3 (three) times daily. 05/07/20   [provider]  Guaifenesin 1200 MG TB12 Take 1 tablet (1,200 mg total) by mouth in the morning and at bedtime. 09/27/22   Carlisle Beers, FNP  hydrochlorothiazide (MICROZIDE) 12.5 MG capsule Take by mouth.    [provider]  ibuprofen (ADVIL) 800  MG tablet Take 1 tablet (800 mg total) by mouth 3 (three) times daily. 09/27/22   Carlisle Beers, FNP  Multiple Vitamin (MULTIVITAMIN WITH MINERALS) TABS tablet Take 1 tablet by mouth daily.    [provider]  pantoprazole (PROTONIX) 20 MG tablet Take 1 tablet (20 mg total) by mouth daily. 01/22/19   Milagros Loll, MD    Family History Family History  Problem Relation Age of Onset   Hypertension Mother    Sleep apnea Father    Diabetes Maternal Grandmother    Heart disease Maternal Grandmother    Hypertension Maternal Grandmother    Gout Maternal Grandmother    Stroke Paternal Grandmother     Social History Social History   Tobacco Use   Smoking status: Some Days    Types: Cigarettes   Smokeless tobacco: Never  Vaping Use   Vaping status: Never Used  Substance Use Topics   Alcohol use: No   Drug use: No     Allergies   Morphine and codeine   Review of Systems Review of Systems  Constitutional:  Positive for fatigue.  Negative for activity change, appetite change, chills and fever.  HENT:  Positive for congestion, postnasal drip and rhinorrhea. Negative for ear discharge, ear pain, hearing loss, mouth sores, sinus pressure, sinus pain, sore throat, trouble swallowing and voice change.   Eyes:  Negative for redness.  Respiratory:  Positive for cough (mild). Negative for chest tightness, shortness of breath and wheezing.   Cardiovascular:  Negative for chest pain.  Gastrointestinal:  Positive for diarrhea. Negative for abdominal distention, abdominal pain, blood in stool, nausea and vomiting.  Musculoskeletal:  Negative for arthralgias and myalgias.  Skin:  Negative for rash.  Neurological:  Positive for light-headedness (slight). Negative for dizziness.     Physical Exam Triage Vital Signs ED Triage Vitals  Encounter Vitals Group     BP 02/25/23 0922 137/84     Systolic BP Percentile --      Diastolic BP Percentile --      Pulse Rate 02/25/23 0922 85     Resp 02/25/23 0922 18     Temp 02/25/23 0922 98 F (36.7 C)     Temp Source 02/25/23 0922 Oral     SpO2 02/25/23 0922 97 %     Weight --      Height --      Head Circumference --      Peak Flow --      Pain Score 02/25/23 0923 4     Pain Loc --      Pain Education --      Exclude from Growth Chart --    No data found.  Updated Vital Signs BP 137/84 (BP Location: Right Arm)   Pulse 85   Temp 98 F (36.7 C) (Oral)   Resp 18   SpO2 97%   Visual Acuity Right Eye Distance:   Left Eye Distance:   Bilateral Distance:    Right Eye Near:   Left Eye Near:    Bilateral Near:     Physical Exam Vitals reviewed.  Constitutional:      Appearance: Normal appearance. She is normal weight.  HENT:     Head: Normocephalic and atraumatic.     Right Ear: Tympanic membrane and ear canal normal.     Left Ear: Tympanic membrane and ear canal normal.     Ears:     Comments: Clear effusion on left    Nose:  Congestion present. No rhinorrhea.      Mouth/Throat:     Mouth: Mucous membranes are moist.     Pharynx: No oropharyngeal exudate or posterior oropharyngeal erythema.     Comments: Clear postnasal discharge present without cobblestoning.  Mild edema of the tonsils.  No erythema Eyes:     General:        Right eye: No discharge.        Left eye: No discharge.     Extraocular Movements: Extraocular movements intact.     Conjunctiva/sclera: Conjunctivae normal.     Pupils: Pupils are equal, round, and reactive to light.  Cardiovascular:     Rate and Rhythm: Normal rate and regular rhythm.  Pulmonary:     Effort: Pulmonary effort is normal.     Breath sounds: Normal breath sounds. No wheezing or rales.  Musculoskeletal:     Cervical back: Normal range of motion.  Neurological:     Mental Status: She is alert.      UC Treatments / Results  Labs (all labs ordered are listed, but only abnormal results are displayed) Labs Reviewed  SARS CORONAVIRUS 2 (TAT 6-24 HRS)    EKG   Radiology No results found.  Procedures Procedures (including critical care time)  Medications Ordered in UC Medications - No data to display  Initial Impression / Assessment and Plan / UC Course  I have reviewed the triage vital signs and the nursing notes.  Pertinent labs & imaging results that were available during my care of the patient were reviewed by me and considered in my medical decision making (see chart for details).     Upper respiratory infection, stable - Rapid COVID was negative - No concerns for pneumonia.  We discussed the options for treatment of influenza.  Since the patient would not want treatment for this we will not test today. - We discussed exposure precautions, increased hydration as she is fairly dehydrated, starting symptomatic management, and monitoring for worsening of her symptoms. - The patient voiced understanding agree with plan   Final Clinical Impressions(s) / UC Diagnoses   Final diagnoses:   Viral upper respiratory tract infection  Dehydration     Discharge Instructions      You have an upper respiratory infection. Most cases are due to a virus and do not require antibiotics for treatment.  Make sure to continue good oral hydration.  You can take tylenol for fever as needed Start Flonase daily for the next week and then as needed. You can use nasal saline spray multiple times daily as well.  You can use a daily antihistamine or guaifenesin as an expectorant but you need to be well hydrated for these medications to work.  Get adequate rest for recovery Maintain distance from others and wear a mask in public areas to avoid spread  If you start to experience shortness of breath, fevers that don't respond to medication, confusion, profound neck stiffness, or fainting, return to the urgent care or ED.       ED Prescriptions   None    PDMP not reviewed this encounter.   Ivor Messier, MD 02/25/23 1039

## 2023-02-26 LAB — SARS CORONAVIRUS 2 (TAT 6-24 HRS): SARS Coronavirus 2: NEGATIVE

## 2023-04-28 ENCOUNTER — Encounter (HOSPITAL_COMMUNITY): Payer: Self-pay | Admitting: Internal Medicine

## 2023-04-28 ENCOUNTER — Ambulatory Visit (HOSPITAL_COMMUNITY)
Admission: EM | Admit: 2023-04-28 | Discharge: 2023-04-28 | Disposition: A | Discharge status: Home / Self Care | Payer: Commercial Managed Care - PPO | Attending: Internal Medicine | Admitting: Internal Medicine

## 2023-04-28 ENCOUNTER — Ambulatory Visit (INDEPENDENT_AMBULATORY_CARE_PROVIDER_SITE_OTHER): Payer: Commercial Managed Care - PPO

## 2023-04-28 DIAGNOSIS — G8929 Other chronic pain: Secondary | ICD-10-CM

## 2023-04-28 DIAGNOSIS — R1011 Right upper quadrant pain: Secondary | ICD-10-CM

## 2023-04-28 DIAGNOSIS — K5909 Other constipation: Secondary | ICD-10-CM

## 2023-04-28 NOTE — Discharge Instructions (Addendum)
Drink 8 oz of prune juice daily to prevent constipation If the severe pain comes back, please go to the ER to have further test done.

## 2023-04-28 NOTE — ED Provider Notes (Signed)
[facility]    CSN: [id number] Arrival date & time: 04/28/23  1100      History   Chief Complaint Chief Complaint  Patient presents with   Abdominal Pain    HPI Victoria Rivers is a 48 y.o. female who presents with RUQ abdominal pain x 2 days at times with radiation of pain to R back. Nothing makes ir worse. She thought it was due to constipation sinceshe had not had a BM for 2-3 days. She drank a bottle of Mag Citrate which helped a the pain a little, from 10/10 to now 6/10.Marland Kitchen She had 3 loose stools today. She was awake all night due to the pain til she had 3 BM's this am Has not had a fever, N./V  Past Medical History:  Diagnosis Date   Anxiety    no meds   BV (bacterial vaginosis)    Chlamydia    Gonorrhea    Hypertension    Pregnancy, ectopic     Patient Active Problem List   Diagnosis Date Noted   Vaginal bleeding 05/19/2022   Acute viral bronchitis 05/05/2014   Sinus tachycardia 05/05/2014   Lactic acidosis 05/05/2014   Nausea and vomiting 05/05/2014   Hypokalemia 05/05/2014   Influenza with respiratory manifestation     Past Surgical History:  Procedure Laterality Date   DILATION AND CURETTAGE OF UTERUS     ECTOPIC PREGNANCY SURGERY     pt denies, states only received MTX   THERAPEUTIC ABORTION      OB History     Gravida  8   Para  4   Term  4   Preterm      AB  3   Living  4      SAB      IAB  2   Ectopic  1   Multiple      Live Births  4        Obstetric Comments  MTX for ectopic          Home Medications    Prior to Admission medications   Medication Sig Start Date End Date Taking? Authorizing Provider  cetirizine (ZYRTEC) 10 MG tablet Take 1 tablet (10 mg total) by mouth daily. 05/29/21 06/28/21  Leath-Warren, Sadie Haber, NP  gabapentin (NEURONTIN) 300 MG capsule Take 300 mg by mouth 3 (three) times daily. 05/07/20   [provider]  hydrochlorothiazide (MICROZIDE) 12.5 MG capsule Take by mouth.     [provider]  pantoprazole (PROTONIX) 20 MG tablet Take 1 tablet (20 mg total) by mouth daily. 01/22/19   Milagros Loll, MD    Family History Family History  Problem Relation Age of Onset   Hypertension Mother    Sleep apnea Father    Diabetes Maternal Grandmother    Heart disease Maternal Grandmother    Hypertension Maternal Grandmother    Gout Maternal Grandmother    Stroke Paternal Grandmother     Social History Social History   Tobacco Use   Smoking status: Some Days    Types: Cigarettes   Smokeless tobacco: Never  Vaping Use   Vaping status: Never Used  Substance Use Topics   Alcohol use: No   Drug use: No     Allergies   Morphine and codeine   Review of Systems Review of Systems  As noted in HPI Physical Exam Triage Vital Signs ED Triage Vitals [04/28/23 1227]  Encounter Vitals Group     BP 117/78  Systolic BP Percentile      Diastolic BP Percentile      Pulse Rate 82     Resp 16     Temp 98.3 F (36.8 C)     Temp Source Oral     SpO2 98 %     Weight      Height      Head Circumference      Peak Flow      Pain Score 6     Pain Loc      Pain Education      Exclude from Growth Chart    No data found.  Updated Vital Signs BP 117/78 (BP Location: Left Arm)   Pulse 82   Temp 98.3 F (36.8 C) (Oral)   Resp 16   LMP 04/06/2023 (Approximate)   SpO2 98%   Visual Acuity Right Eye Distance:   Left Eye Distance:   Bilateral Distance:    Right Eye Near:   Left Eye Near:    Bilateral Near:     Physical Exam Vitals and nursing note reviewed.  Constitutional:      General: She is not in acute distress.    Appearance: She is obese. She is not ill-appearing, toxic-appearing or diaphoretic.  Pulmonary:     Effort: Pulmonary effort is normal.  Abdominal:     General: Abdomen is protuberant. Bowel sounds are decreased.     Palpations: Abdomen is soft. There is no hepatomegaly, splenomegaly or mass.     Tenderness: There  is abdominal tenderness in the right upper quadrant and epigastric area. There is no guarding or rebound.     Hernia: No hernia is present.  Skin:    General: Skin is warm and dry.  Neurological:     Mental Status: She is alert.  Psychiatric:        Mood and Affect: Mood normal.        Behavior: Behavior normal.      UC Treatments / Results  Labs (all labs ordered are listed, but only abnormal results are displayed) Labs Reviewed - No data to display  EKG   Radiology DG Abd 2 Views Result Date: 04/28/2023 CLINICAL DATA:  Acute right upper quadrant abdominal pain. EXAM: ABDOMEN - 2 VIEW COMPARISON:  Jul 08, 2014. FINDINGS: The bowel gas pattern is normal. No significant stool burden is noted. There is no evidence of free air. No radio-opaque calculi or other significant radiographic abnormality is seen. IMPRESSION: Negative. Electronically Signed   By: Lupita Raider M.D.   On: 04/28/2023 13:42    Procedures Procedures (including critical care time)  Medications Ordered in UC Medications - No data to display  Initial Impression / Assessment and Plan / UC Course  I have reviewed the triage vital signs and the nursing notes.  Pertinent  imaging results that were available during my care of the patient were reviewed by me and considered in my medical decision making (see chart for details).  RUQ pain which improved after having 3 BM's, may just be constipation and gas  She was advised to use Colase or drink prune juice to prevent future constipation, and for today to tray GasX to help pass the rest of the gas she has in her colon. I educated her about cholelithiasis and symptoms to watch out for, so if her pain comes back as severe, she needs to FU with PCP or go to ER.    Final Clinical Impressions(s) / UC Diagnoses  Final diagnoses:  Abdominal pain, chronic, right upper quadrant  Abdominal pain, right upper quadrant  Other constipation     Discharge Instructions       Drink 8 oz of prune juice daily to prevent constipation If the severe pain comes back, please go to the ER to have further test done.      ED Prescriptions   None    PDMP not reviewed this encounter.   Garey Ham, PA-C 04/28/23 1526

## 2023-04-28 NOTE — ED Triage Notes (Signed)
Patient c/o RUQ abdominal pain x 2 days. Patient states she took a laxative last night because she felt constipated and her last BM was 2-3 days ago. Patient had a BM today and states it was liquid.

## 2023-05-02 ENCOUNTER — Emergency Department: Payer: Commercial Managed Care - PPO

## 2023-05-02 ENCOUNTER — Other Ambulatory Visit: Payer: Self-pay

## 2023-05-02 ENCOUNTER — Emergency Department
Admission: EM | Admit: 2023-05-02 | Discharge: 2023-05-02 | Disposition: A | Discharge status: Home / Self Care | Payer: Commercial Managed Care - PPO | Attending: Emergency Medicine | Admitting: Emergency Medicine

## 2023-05-02 ENCOUNTER — Encounter: Payer: Self-pay | Admitting: Intensive Care

## 2023-05-02 DIAGNOSIS — K8050 Calculus of bile duct without cholangitis or cholecystitis without obstruction: Secondary | ICD-10-CM | POA: Insufficient documentation

## 2023-05-02 DIAGNOSIS — I10 Essential (primary) hypertension: Secondary | ICD-10-CM | POA: Insufficient documentation

## 2023-05-02 DIAGNOSIS — R1011 Right upper quadrant pain: Secondary | ICD-10-CM

## 2023-05-02 LAB — COMPREHENSIVE METABOLIC PANEL
ALT: 13 U/L (ref 0–44)
AST: 21 U/L (ref 15–41)
Albumin: 4 g/dL (ref 3.5–5.0)
Alkaline Phosphatase: 40 U/L (ref 38–126)
Anion gap: 14 (ref 5–15)
BUN: 12 mg/dL (ref 6–20)
CO2: 22 mmol/L (ref 22–32)
Calcium: 8.9 mg/dL (ref 8.9–10.3)
Chloride: 101 mmol/L (ref 98–111)
Creatinine, Ser: 0.79 mg/dL (ref 0.44–1.00)
GFR, Estimated: >60 mL/min (ref >60)
Glucose, Bld: 142 mg/dL — ABNORMAL HIGH (ref 70–99)
Potassium: 3.2 mmol/L — ABNORMAL LOW (ref 3.5–5.1)
Sodium: 137 mmol/L (ref 135–145)
Total Bilirubin: 0.4 mg/dL (ref 0.0–1.2)
Total Protein: 7.3 g/dL (ref 6.5–8.1)

## 2023-05-02 LAB — URINALYSIS, ROUTINE W REFLEX MICROSCOPIC
Bacteria, UA: NONE SEEN
Bilirubin Urine: NEGATIVE
Glucose, UA: NEGATIVE mg/dL
Ketones, ur: NEGATIVE mg/dL
Leukocytes,Ua: NEGATIVE
Nitrite: NEGATIVE
Protein, ur: NEGATIVE mg/dL
RBC / HPF: >50 RBC/hpf (ref 0–5)
Specific Gravity, Urine: 1.013 (ref 1.005–1.030)
pH: 6 (ref 5.0–8.0)

## 2023-05-02 LAB — LIPASE, BLOOD: Lipase: 31 U/L (ref 11–51)

## 2023-05-02 LAB — CBC
HCT: 35.8 % — ABNORMAL LOW (ref 36.0–46.0)
Hemoglobin: 11.1 g/dL — ABNORMAL LOW (ref 12.0–15.0)
MCH: 23.4 pg — ABNORMAL LOW (ref 26.0–34.0)
MCHC: 31 g/dL (ref 30.0–36.0)
MCV: 75.5 fL — ABNORMAL LOW (ref 80.0–100.0)
Platelets: 362 10*3/uL (ref 150–400)
RBC: 4.74 MIL/uL (ref 3.87–5.11)
RDW: 15 % (ref 11.5–15.5)
WBC: 5.3 10*3/uL (ref 4.0–10.5)
nRBC: 0 % (ref 0.0–0.2)

## 2023-05-02 LAB — POC URINE PREG, ED: Preg Test, Ur: NEGATIVE

## 2023-05-02 MED ORDER — KETOROLAC TROMETHAMINE 15 MG/ML IJ SOLN
15.0000 mg | Freq: Once | INTRAMUSCULAR | Status: AC
  Administered 2023-05-02: 15 mg via INTRAVENOUS
  Filled 2023-05-02: qty 1

## 2023-05-02 MED ORDER — SODIUM CHLORIDE 0.9 % IV BOLUS
1000.0000 mL | Freq: Once | INTRAVENOUS | Status: AC
  Administered 2023-05-02: 1000 mL via INTRAVENOUS

## 2023-05-02 NOTE — ED Triage Notes (Signed)
 C/o right sided abdominal pain. Reports she cannot sleep. When she touched the area on abdomen, reports numbness. Reports RUQ pain that results in SOB and travels to upper back X4 days

## 2023-05-02 NOTE — ED Notes (Signed)
 Pt given graham crackers and water and tolerated both well. No nausea or vomiting after.

## 2023-05-02 NOTE — ED Provider Notes (Addendum)
 [facility] Provider Note    Event Date/Time   First MD Initiated Contact with Patient 05/02/23 1522     (approximate)   History   Chief Complaint: Abdominal Pain   HPI  Victoria Rivers is a 48 y.o. female with past history of hypertension who comes ED complaining of right upper quadrant abdominal pain radiating around to the back.  Worse with eating.  Has been keeping her from sleeping for the last couple days.  Constant, waxing and waning.  No fevers chills chest pain shortness of breath or vomiting.  Denies trauma.  Temporary improved by ibuprofen  Denies dysuria        Physical Exam   Triage Vital Signs: ED Triage Vitals  Encounter Vitals Group     BP 05/02/23 1430 124/66     Systolic BP Percentile --      Diastolic BP Percentile --      Pulse Rate 05/02/23 1430 88     Resp 05/02/23 1430 18     Temp 05/02/23 1430 98.7 F (37.1 C)     Temp Source 05/02/23 1427 Oral     SpO2 05/02/23 1430 98 %     Weight 05/02/23 1431 248 lb (112.5 kg)     Height 05/02/23 1431 5\' 6"  (1.676 m)     Head Circumference --      Peak Flow --      Pain Score 05/02/23 1431 10     Pain Loc --      Pain Education --      Exclude from Growth Chart --     Most recent vital signs: Vitals:   05/02/23 1430 05/02/23 1813  BP: 124/66 128/79  Pulse: 88 80  Resp: 18 18  Temp: 98.7 F (37.1 C) 98.4 F (36.9 C)  SpO2: 98% 100%    General: Awake, no distress.  CV:  Good peripheral perfusion.  Regular rate rhythm Resp:  Normal effort.  Clear to auscultation bilaterally Abd:  No distention.  Soft with right upper quadrant tenderness.  No CVA tenderness Other:  No lower extremity edema   ED Results / Procedures / Treatments   Labs (all labs ordered are listed, but only abnormal results are displayed) Labs Reviewed  COMPREHENSIVE METABOLIC PANEL - Abnormal; Notable for the following components:      Result Value   Potassium 3.2 (*)    Glucose, Bld 142 (*)     All other components within normal limits  CBC - Abnormal; Notable for the following components:   Hemoglobin 11.1 (*)    HCT 35.8 (*)    MCV 75.5 (*)    MCH 23.4 (*)    All other components within normal limits  URINALYSIS, ROUTINE W REFLEX MICROSCOPIC - Abnormal; Notable for the following components:   Color, Urine YELLOW (*)    APPearance HAZY (*)    Hgb urine dipstick LARGE (*)    All other components within normal limits  LIPASE, BLOOD  POC URINE PREG, ED     EKG Interpreted by me Normal sinus rhythm rate of 84.  Normal axis intervals QRS ST segments and T waves   RADIOLOGY Ultrasound right upper quadrant interpreted by me, shows numerous gallstones without frank signs of cholecystitis.  Radiology report reviewed   PROCEDURES:  Procedures   MEDICATIONS ORDERED IN ED: Medications  sodium chloride 0.9 % bolus 1,000 mL (0 mLs Intravenous Stopped 05/02/23 1846)  ketorolac (TORADOL) 15 MG/ML injection 15 mg (15  mg Intravenous Given 05/02/23 1638)     IMPRESSION / MDM / ASSESSMENT AND PLAN / ED COURSE  I reviewed the triage vital signs and the nursing notes.  DDx: Cholecystitis, choledocholithiasis, pancreatitis, GERD, pyelonephritis  Patient's presentation is most consistent with acute presentation with potential threat to life or bodily function.  Patient presents with right upper quadrant pain and tenderness.  Vital signs are normal, she is nontoxic, lab panel is unremarkable.  With focal tenderness over the right upper quadrant, will obtain ultrasound to evaluate gallbladder.  Will give IV fluids and Toradol for hydration and pain relief.   Clinical Course as of 05/02/23 2022  Wynelle Link May 02, 2023  1831 Ultrasound reveals gallbladder filled with numerous stones without frank signs of cholecystitis.  Patient reports pain is better.  Abdomen is soft and nontender.  Will p.o. trial. [PS]    Clinical Course User Index [PS] Sharman Cheek, MD     ----------------------------------------- 8:21 PM on 05/02/2023 ----------------------------------------- Tolerating p.o., no recurrent symptoms.  Stable for discharge, patient is comfortable with outpatient follow-up planned with general surgery.   FINAL CLINICAL IMPRESSION(S) / ED DIAGNOSES   Final diagnoses:  RUQ pain  Biliary colic     Rx / DC Orders   ED Discharge Orders     None        Note:  This document was prepared using Dragon voice recognition software and may include unintentional dictation errors.   Sharman Cheek, MD 05/02/23 2022    Sharman Cheek, MD 05/02/23 2022

## 2023-05-10 ENCOUNTER — Ambulatory Visit (INDEPENDENT_AMBULATORY_CARE_PROVIDER_SITE_OTHER): Payer: Self-pay | Admitting: Surgery

## 2023-05-10 VITALS — BP 102/67 | HR 86 | Temp 98.1°F | Ht 66.0 in | Wt 245.0 lb

## 2023-05-10 DIAGNOSIS — K8020 Calculus of gallbladder without cholecystitis without obstruction: Secondary | ICD-10-CM

## 2023-05-10 NOTE — Patient Instructions (Signed)
You have requested to have your gallbladder removed. This will be done at [facility] with Dr. Pabon.  You will most likely be out of work 1-2 weeks for this surgery.  If you have FMLA or disability paperwork that needs filled out you may drop this off at our office or this can be faxed to [phone].  You will return after your post-op appointment with a lifting restriction for approximately 4 more weeks.  You will be able to eat anything you would like to following surgery. But, start by eating a bland diet and advance this as tolerated. The Gallbladder diet is below, please go as closely by this diet as possible prior to surgery to avoid any further attacks.  Please see the (blue)pre-care form that you have been given today. Our surgery scheduler will call you to verify surgery date and to go over information.   If you have any questions, please call our office.  Laparoscopic Cholecystectomy Laparoscopic cholecystectomy is surgery to remove the gallbladder. The gallbladder is located in the upper right part of the abdomen, behind the liver. It is a storage sac for bile, which is produced in the liver. Bile aids in the digestion and absorption of fats. Cholecystectomy is often done for inflammation of the gallbladder (cholecystitis). This condition is usually caused by a buildup of gallstones (cholelithiasis) in the gallbladder. Gallstones can block the flow of bile, and that can result in inflammation and pain. In severe cases, emergency surgery may be required. If emergency surgery is not required, you will have time to prepare for the procedure. Laparoscopic surgery is an alternative to open surgery. Laparoscopic surgery has a shorter recovery time. Your common bile duct may also need to be examined during the procedure. If stones are found in the common bile duct, they may be removed. LET YOUR HEALTH CARE PROVIDER KNOW ABOUT: Any allergies you have. All medicines you are taking,  including vitamins, herbs, eye drops, creams, and over-the-counter medicines. Previous problems you or members of your family have had with the use of anesthetics. Any blood disorders you have. Previous surgeries you have had.  Any medical conditions you have. RISKS AND COMPLICATIONS Generally, this is a safe procedure. However, problems may occur, including: Infection. Bleeding. Allergic reactions to medicines. Damage to other structures or organs. A stone remaining in the common bile duct. A bile leak from the cyst duct that is clipped when your gallbladder is removed. The need to convert to open surgery, which requires a larger incision in the abdomen. This may be necessary if your surgeon thinks that it is not safe to continue with a laparoscopic procedure. BEFORE THE PROCEDURE Ask your health care provider about: Changing or stopping your regular medicines. This is especially important if you are taking diabetes medicines or blood thinners. Taking medicines such as aspirin and ibuprofen. These medicines can thin your blood. Do not take these medicines before your procedure if your health care provider instructs you not to. Follow instructions from your health care provider about eating or drinking restrictions. Let your health care provider know if you develop a cold or an infection before surgery. Plan to have someone take you home after the procedure. Ask your health care provider how your surgical site will be marked or identified. You may be given antibiotic medicine to help prevent infection. PROCEDURE To reduce your risk of infection: Your health care team will wash or sanitize their hands. Your skin will be washed with soap. An IV   tube may be inserted into one of your veins. You will be given a medicine to make you fall asleep (general anesthetic). A breathing tube will be placed in your mouth. The surgeon will make several small cuts (incisions) in your abdomen. A thin,  lighted tube (laparoscope) that has a tiny camera on the end will be inserted through one of the small incisions. The camera on the laparoscope will send a picture to a TV screen (monitor) in the operating room. This will give the surgeon a good view inside your abdomen. A gas will be pumped into your abdomen. This will expand your abdomen to give the surgeon more room to perform the surgery. Other tools that are needed for the procedure will be inserted through the other incisions. The gallbladder will be removed through one of the incisions. After your gallbladder has been removed, the incisions will be closed with stitches (sutures), staples, or skin glue. Your incisions may be covered with a bandage (dressing). The procedure may vary among health care providers and hospitals. AFTER THE PROCEDURE Your blood pressure, heart rate, breathing rate, and blood oxygen level will be monitored often until the medicines you were given have worn off. You will be given medicines as needed to control your pain.   This information is not intended to replace advice given to you by your health care provider. Make sure you discuss any questions you have with your health care provider.   Document Released: 02/23/2005 Document Revised: 11/14/2014 Document Reviewed: 10/05/2012 Elsevier Interactive Patient Education 2016 Elsevier Inc.   Low-Fat Diet for Gallbladder Conditions A low-fat diet can be helpful if you have pancreatitis or a gallbladder condition. With these conditions, your pancreas and gallbladder have trouble digesting fats. A healthy eating plan with less fat will help rest your pancreas and gallbladder and reduce your symptoms. WHAT DO I NEED TO KNOW ABOUT THIS DIET? Eat a low-fat diet. Reduce your fat intake to less than 20-30% of your total daily calories. This is less than 50-60 g of fat per day. Remember that you need some fat in your diet. Ask your dietician what your daily goal should  be. Choose nonfat and low-fat healthy foods. Look for the words "nonfat," "low fat," or "fat free." As a guide, look on the label and choose foods with less than 3 g of fat per serving. Eat only one serving. Avoid alcohol. Do not smoke. If you need help quitting, talk with your health care provider. Eat small frequent meals instead of three large heavy meals. WHAT FOODS CAN I EAT? Grains Include healthy grains and starches such as potatoes, wheat bread, fiber-rich cereal, and brown rice. Choose whole grain options whenever possible. In adults, whole grains should account for 45-65% of your daily calories.  Fruits and Vegetables Eat plenty of fruits and vegetables. Fresh fruits and vegetables add fiber to your diet. Meats and Other Protein Sources Eat lean meat such as chicken and pork. Trim any fat off of meat before cooking it. Eggs, fish, and beans are other sources of protein. In adults, these foods should account for 10-35% of your daily calories. Dairy Choose low-fat milk and dairy options. Dairy includes fat and protein, as well as calcium.  Fats and Oils Limit high-fat foods such as fried foods, sweets, baked goods, sugary drinks.  Other Creamy sauces and condiments, such as mayonnaise, can add extra fat. Think about whether or not you need to use them, or use smaller amounts or low fat options.   WHAT FOODS ARE NOT RECOMMENDED? High fat foods, such as: Baked goods. Ice cream. French toast. Sweet rolls. Pizza. Cheese bread. Foods covered with batter, butter, creamy sauces, or cheese. Fried foods. Sugary drinks and desserts. Foods that cause gas or bloating   This information is not intended to replace advice given to you by your health care provider. Make sure you discuss any questions you have with your health care provider.   Document Released: 02/28/2013 Document Reviewed: 02/28/2013 Elsevier Interactive Patient Education 2016 Elsevier Inc.   

## 2023-05-11 ENCOUNTER — Telehealth: Payer: Self-pay | Admitting: Surgery

## 2023-05-11 NOTE — Telephone Encounter (Signed)
 Left message for patient to call, please inform her of the following regarding scheduled surgery with Dr. Everlene Farrier.   Pre-Admission date/time, and Surgery date at [facility].  Surgery Date: 05/20/23 Preadmission Testing Date: 05/13/23 (phone 1p-4p)  Also patient will need to call at [phone], between 1-3:00pm the day before surgery, to find out what time to arrive for surgery.

## 2023-05-12 ENCOUNTER — Encounter: Payer: Self-pay | Admitting: Surgery

## 2023-05-12 NOTE — Progress Notes (Signed)
 Surgical Consultation    Victoria Rivers is an 48 y.o. female.   Chief Complaint  Patient presents with   New Patient (Initial Visit)    gallstones     HPI: Patient seen in consultation at the request of Dr. Scotty Court.  Recently presented to the emergency room 10 days ago for some with an episode of right upper quadrant pain.  The pain was moderate intermittent, dull radiated to the back.  The pain seems to be worsening when eating fatty meals.  No other specific alleviating or aggravating factors.  She was observed in the ER where appropriate workup was performed including ultrasound that I have personally reviewed showing evidence of cholelithiasis normal common bile duct.  She also had CBC and CMP that were normal. SHe has changed her diet with some improvement of symptoms but she is still has some intermittent abdominal pain.  He also endorses some weakness Did have a prior D&C. No family history of biliary disease.  Past Medical History:  Diagnosis Date   Anxiety    no meds   BV (bacterial vaginosis)    Chlamydia    Gonorrhea    Hypertension    Pregnancy, ectopic     Past Surgical History:  Procedure Laterality Date   DILATION AND CURETTAGE OF UTERUS     ECTOPIC PREGNANCY SURGERY     pt denies, states only received MTX   THERAPEUTIC ABORTION      Family History  Problem Relation Age of Onset   Hypertension Mother    Sleep apnea Father    Diabetes Maternal Grandmother    Heart disease Maternal Grandmother    Hypertension Maternal Grandmother    Gout Maternal Grandmother    Stroke Paternal Grandmother     Social History:  reports that she has been smoking cigarettes. She has never used smokeless tobacco. She reports current alcohol use. She reports that she does not use drugs.  Allergies:  Allergies  Allergen Reactions   Morphine And Codeine Rash    Medications reviewed.     ROS Full ROS performed and is otherwise negative other than what is stated in  the HPI    BP 102/67   Pulse 86   Temp 98.1 F (36.7 C)   Ht 5\' 6"  (1.676 m)   Wt 245 lb (111.1 kg)   LMP 05/01/2023 (Exact Date)   SpO2 98%   BMI 39.54 kg/m   Physical Exam Vitals and nursing note reviewed. Exam conducted with a chaperone present.  Constitutional:      General: She is not in acute distress.    Appearance: Normal appearance. She is not ill-appearing.  Cardiovascular:     Rate and Rhythm: Normal rate and regular rhythm.     Heart sounds: No murmur heard. Pulmonary:     Effort: Pulmonary effort is normal. No respiratory distress.     Breath sounds: Normal breath sounds. No stridor. No wheezing or rhonchi.  Abdominal:     General: Abdomen is flat. There is no distension.     Palpations: Abdomen is soft. There is no mass.     Tenderness: There is abdominal tenderness. There is no guarding or rebound.     Hernia: No hernia is present.     Comments: No peritonitis no Murphy sign  Musculoskeletal:     Cervical back: Normal range of motion and neck supple. No rigidity or tenderness.  Skin:    General: Skin is warm and dry.  Capillary Refill: Capillary refill takes less than 2 seconds.  Neurological:     General: No focal deficit present.     Mental Status: She is alert and oriented to person, place, and time.  Psychiatric:        Mood and Affect: Mood normal.        Behavior: Behavior normal.        Thought Content: Thought content normal.        Judgment: Judgment normal.       Assessment/Plan: 48 year old female with classic signs and symptoms of biliary colic.  Symptoms have been intermittent but constant.  Discussed with the patient in detail about her disease process.  I definitely recommend cholecystectomy and I do think that she will be a candidate for robotic approach The risks, benefits, complications, treatment options, and expected outcomes were discussed with the patient. The possibilities of bleeding, recurrent infection, finding a normal  gallbladder, perforation of viscus organs, damage to surrounding structures, bile leak, abscess formation, needing a drain placed, the need for additional procedures, reaction to medication, pulmonary aspiration,  failure to diagnose a condition, the possible need to convert to an open procedure, and creating a complication requiring transfusion or operation were discussed with the patient. The patient and/or family concurred with the proposed plan, giving informed consent.  Is not that I spent 60 minutes in this encounter including extensive review of medical records, personally reviewing imaging studies, coordinating her care, placing orders, counseling the patient and performing appropriate documentation  Sterling Big, MD [facility] General Surgeon

## 2023-05-12 NOTE — Telephone Encounter (Signed)
 Patient returns call and she now wants to cancel surgery.  When asked why, she states that she is going for a 2nd consult.  Does not wish to reschedule at this time, if changes her mind will call us back.  Surgery for 05/20/23 is cancelled at patient's request.

## 2023-05-13 ENCOUNTER — Inpatient Hospital Stay: Admission: RE | Admit: 2023-05-13 | Source: Ambulatory Visit

## 2023-05-13 ENCOUNTER — Ambulatory Visit: Payer: Self-pay | Admitting: Surgery

## 2023-05-13 NOTE — H&P (Signed)
 Victoria Rivers [id number]   Referring Provider:  Self   Subjective   Chief Complaint: New Consultation     History of Present Illness:    48 year old woman with history of hypertension, anxiety, obesity, tobacco abuse who presents for consultation regarding biliary colic.  She presented to the emergency room at [facility] on 2/23 with right upper quadrant pain radiating to her back.  Aggravated by eating, especially fatty foods, and had been going on for a few days, waxing and waning.  No associated fevers, chills, shortness of breath, or vomiting.  Minimal improvement with ibuprofen.  Her symptoms were ultimately controlled with Toradol/morphine and she was discharged with outpatient surgery follow-up.  Since that time she has maintained a low-fat diet and her symptoms have been improved but still intermittently ongoing.  She notes she developed a pruritic rash which is likely from the morphine.  She met with Dr. Everlene Farrier on 3/3 and he recommended robotic cholecystectomy, which was scheduled on 3/13. Patient presents for second opinion.  She continues to have twinges of right upper quadrant pain with eating despite pretty good compliance with the low-fat diet.  She is a driver/works in transit.   Lab work done 05/02/2023: CMP unremarkable; CBC notable only for hemoglobin 11.1 Ultrasound on same day notes numerous gallstones.  Gallbladder wall thickness 4.4 mm, common bile duct 3.9 mm, no sonographic sign.  Hepatic steatosis.   Review of Systems: A complete review of systems was obtained from the patient.  I have reviewed this information and discussed as appropriate with the patient.  See HPI as well for other ROS.   Medical History: Past Medical History:  Diagnosis Date   Anxiety    Arthritis     There is no problem list on file for this patient.   History reviewed. No pertinent surgical history.   Allergies  Allergen Reactions   Morphine Rash    Current Outpatient Medications on  File Prior to Visit  Medication Sig Dispense Refill   hydroCHLOROthiazide (HYDRODIURIL) 25 MG tablet Take 25 mg by mouth once daily     No current facility-administered medications on file prior to visit.    Family History  Problem Relation Age of Onset   Obesity Mother    Coronary Artery Disease (Blocked arteries around heart) Mother      Social History   Tobacco Use  Smoking Status Every Day   Types: Cigarettes  Smokeless Tobacco Never     Social History   Socioeconomic History   Marital status: Single  Tobacco Use   Smoking status: Every Day    Types: Cigarettes   Smokeless tobacco: Never  Substance and Sexual Activity   Alcohol use: Never   Drug use: Never   Social Drivers of Health    Received from [employer]   Social Network  Housing Stability: Unknown (05/13/2023)   Housing Stability Vital Sign    Homeless in the Last Year: No    Objective:    Vitals:   05/13/23 1012 05/13/23 1013  BP: 128/78   Pulse: 104   Temp: 36.4 C (97.5 F)   SpO2: 99%   Weight: (!) 113.9 kg (251 lb 3.2 oz)   Height: 167.6 cm (5\' 6" )   PainSc:  0-No pain  PainLoc:  Abdomen    Body mass index is 40.54 kg/m.  Gen: A&Ox3, no distress  Unlabored respirations Abd soft, mildly tender in the epigastrium and right upper quadrant without peritoneal signs or palpable mass.  Assessment and  Plan:  Diagnoses and all orders for this visit:  Biliary colic    I recommend proceeding with laparoscopic or robotic cholecystectomy with possible cholangiogram.  Discussed the relevant anatomy using a diagram to demonstrate, and went over surgical technique.  Discussed risks of surgery including bleeding, pain, scarring, intraabdominal injury specifically to the common bile duct and sequelae, subtotal cholecystectomy, bile leak, conversion to open surgery, failure to resolve symptoms, blood clots/ pulmonary embolus, heart attack, pneumonia, stroke, death. Questions welcomed and answered to  patient's satisfaction.  Patient wishes to proceed with surgery.   Helmer Dull Carlye Grippe, MD

## 2023-05-13 NOTE — H&P (View-Only) (Signed)
 Victoria Rivers [id number]   Referring Provider:  Self   Subjective   Chief Complaint: New Consultation     History of Present Illness:    48 year old woman with history of hypertension, anxiety, obesity, tobacco abuse who presents for consultation regarding biliary colic.  She presented to the emergency room at [facility] on 2/23 with right upper quadrant pain radiating to her back.  Aggravated by eating, especially fatty foods, and had been going on for a few days, waxing and waning.  No associated fevers, chills, shortness of breath, or vomiting.  Minimal improvement with ibuprofen.  Her symptoms were ultimately controlled with Toradol/morphine and she was discharged with outpatient surgery follow-up.  Since that time she has maintained a low-fat diet and her symptoms have been improved but still intermittently ongoing.  She notes she developed a pruritic rash which is likely from the morphine.  She met with Dr. Everlene Farrier on 3/3 and he recommended robotic cholecystectomy, which was scheduled on 3/13. Patient presents for second opinion.  She continues to have twinges of right upper quadrant pain with eating despite pretty good compliance with the low-fat diet.  She is a driver/works in transit.   Lab work done 05/02/2023: CMP unremarkable; CBC notable only for hemoglobin 11.1 Ultrasound on same day notes numerous gallstones.  Gallbladder wall thickness 4.4 mm, common bile duct 3.9 mm, no sonographic sign.  Hepatic steatosis.   Review of Systems: A complete review of systems was obtained from the patient.  I have reviewed this information and discussed as appropriate with the patient.  See HPI as well for other ROS.   Medical History: Past Medical History:  Diagnosis Date   Anxiety    Arthritis     There is no problem list on file for this patient.   History reviewed. No pertinent surgical history.   Allergies  Allergen Reactions   Morphine Rash    Current Outpatient Medications on  File Prior to Visit  Medication Sig Dispense Refill   hydroCHLOROthiazide (HYDRODIURIL) 25 MG tablet Take 25 mg by mouth once daily     No current facility-administered medications on file prior to visit.    Family History  Problem Relation Age of Onset   Obesity Mother    Coronary Artery Disease (Blocked arteries around heart) Mother      Social History   Tobacco Use  Smoking Status Every Day   Types: Cigarettes  Smokeless Tobacco Never     Social History   Socioeconomic History   Marital status: Single  Tobacco Use   Smoking status: Every Day    Types: Cigarettes   Smokeless tobacco: Never  Substance and Sexual Activity   Alcohol use: Never   Drug use: Never   Social Drivers of Health    Received from [employer]   Social Network  Housing Stability: Unknown (05/13/2023)   Housing Stability Vital Sign    Homeless in the Last Year: No    Objective:    Vitals:   05/13/23 1012 05/13/23 1013  BP: 128/78   Pulse: 104   Temp: 36.4 C (97.5 F)   SpO2: 99%   Weight: (!) 113.9 kg (251 lb 3.2 oz)   Height: 167.6 cm (5\' 6" )   PainSc:  0-No pain  PainLoc:  Abdomen    Body mass index is 40.54 kg/m.  Gen: A&Ox3, no distress  Unlabored respirations Abd soft, mildly tender in the epigastrium and right upper quadrant without peritoneal signs or palpable mass.  Assessment and  Plan:  Diagnoses and all orders for this visit:  Biliary colic    I recommend proceeding with laparoscopic or robotic cholecystectomy with possible cholangiogram.  Discussed the relevant anatomy using a diagram to demonstrate, and went over surgical technique.  Discussed risks of surgery including bleeding, pain, scarring, intraabdominal injury specifically to the common bile duct and sequelae, subtotal cholecystectomy, bile leak, conversion to open surgery, failure to resolve symptoms, blood clots/ pulmonary embolus, heart attack, pneumonia, stroke, death. Questions welcomed and answered to  patient's satisfaction.  Patient wishes to proceed with surgery.   Helmer Dull Carlye Grippe, MD

## 2023-05-19 ENCOUNTER — Encounter (HOSPITAL_COMMUNITY): Payer: Self-pay

## 2023-05-19 NOTE — Patient Instructions (Signed)
 SURGICAL WAITING ROOM VISITATION  Patients having surgery or a procedure may have no more than 2 support people in the waiting area - these visitors may rotate.    Children under the age of 77 must have an adult with them who is not the patient.  Due to an increase in RSV and influenza rates and associated hospitalizations, children ages 69 and under may not visit patients in [facility] hospitals.  Visitors with respiratory illnesses are discouraged from visiting and should remain at home.  If the patient needs to stay at the hospital during part of their recovery, the visitor guidelines for inpatient rooms apply. Pre-op nurse will coordinate an appropriate time for 1 support person to accompany patient in pre-op.  This support person may not rotate.    Please refer to the [facility] website for the visitor guidelines for Inpatients (after your surgery is over and you are in a regular room).       Your procedure is scheduled on: 05-24-23   Report to [facility] Main Entrance    Report to admitting at     0645  AM   Call this number if you have problems the morning of surgery [phone]   Do not eat food or drink liquids  :After Midnight.                  If you have questions, please contact your surgeon's office.   FOLLOW  ANY ADDITIONAL PRE OP INSTRUCTIONS YOU RECEIVED FROM YOUR SURGEON'S OFFICE!!!     Oral Hygiene is also important to reduce your risk of infection.                                    Remember - BRUSH YOUR TEETH THE MORNING OF SURGERY WITH YOUR REGULAR TOOTHPASTE  DENTURES WILL BE REMOVED PRIOR TO SURGERY PLEASE DO NOT APPLY "Poly grip" OR ADHESIVES!!!   Do NOT smoke after Midnight   Stop all vitamins and herbal supplements 7 days before surgery.    Take these medicines the morning of surgery with A SIP OF WATER: NONE                                 You may not have any metal on your body including hair pins, jewelry, and body  piercing             Do not wear make-up, lotions, powders, perfumes/cologne, or deodorant  Do not wear nail polish including gel and S&S, artificial/acrylic nails, or any other type of covering on natural nails including finger and toenails. If you have artificial nails, gel coating, etc. that needs to be removed by a nail salon please have this removed prior to surgery or surgery may need to be canceled/ delayed if the surgeon/ anesthesia feels like they are unable to be safely monitored.   Do not shave  48 hours prior to surgery.                Do not bring valuables to the hospital. [place] IS NOT             RESPONSIBLE   FOR VALUABLES.   Contacts, glasses, dentures or bridgework may not be worn into surgery.   Bring small overnight bag day of surgery.   DO NOT [facility] HOME MEDICATIONS  TO THE HOSPITAL. PHARMACY WILL DISPENSE MEDICATIONS LISTED ON YOUR MEDICATION LIST TO YOU DURING YOUR ADMISSION IN THE HOSPITAL!    Patients discharged on the day of surgery will not be allowed to drive home.  Someone NEEDS to stay with you for the first 24 hours after anesthesia.   Special Instructions: Bring a copy of your healthcare power of attorney and living will documents the day of surgery if you haven't scanned them before.              Please read over the following fact sheets you were given: IF YOU HAVE QUESTIONS ABOUT YOUR PRE-OP INSTRUCTIONS PLEASE CALL [phone]    If you test positive for Covid or have been in contact with anyone that has tested positive in the last 10 days please notify you surgeon.    [place] - Preparing for Surgery Before surgery, you can play an important role.  Because skin is not sterile, your skin needs to be as free of germs as possible.  You can reduce the number of germs on your skin by washing with CHG (chlorahexidine gluconate) soap before surgery.  CHG is an antiseptic cleaner which kills germs and bonds with the skin to continue killing  germs even after washing. Please DO NOT use if you have an allergy to CHG or antibacterial soaps.  If your skin becomes reddened/irritated stop using the CHG and inform your nurse when you arrive at Short Stay. Do not shave (including legs and underarms) for at least 48 hours prior to the first CHG shower.  You may shave your face/neck. Please follow these instructions carefully:  1.  Shower with CHG Soap the night before surgery and the  morning of Surgery.  2.  If you choose to wash your hair, wash your hair first as usual with your  normal  shampoo.  3.  After you shampoo, rinse your hair and body thoroughly to remove the  shampoo.                           4.  Use CHG as you would any other liquid soap.  You can apply chg directly  to the skin and wash                       Gently with a scrungie or clean washcloth.  5.  Apply the CHG Soap to your body ONLY FROM THE NECK DOWN.   Do not use on face/ open                           Wound or open sores. Avoid contact with eyes, ears mouth and genitals (private parts).                       Wash face,  Genitals (private parts) with your normal soap.             6.  Wash thoroughly, paying special attention to the area where your surgery  will be performed.  7.  Thoroughly rinse your body with warm water from the neck down.  8.  DO NOT shower/wash with your normal soap after using and rinsing off  the CHG Soap.                9.  Pat yourself dry with a clean towel.  10.  Wear clean pajamas.            11.  Place clean sheets on your bed the night of your first shower and do not  sleep with pets. Day of Surgery : Do not apply any lotions/deodorants the morning of surgery.  Please wear clean clothes to the hospital/surgery center.  FAILURE TO FOLLOW THESE INSTRUCTIONS MAY RESULT IN THE CANCELLATION OF YOUR SURGERY PATIENT SIGNATURE_________________________________  NURSE  SIGNATURE__________________________________  ________________________________________________________________________

## 2023-05-19 NOTE — Progress Notes (Signed)
 PCP - Dr. Sheral [facility] family [facility] Cardiologist - no  PPM/ICD -  Device Orders -  Rep Notified -   Chest x-ray -  EKG - 05-03-23 epic Stress Test -  ECHO -  Cardiac Cath -   Sleep Study -  CPAP -   Fasting Blood Sugar -  Checks Blood Sugar _____ times a day  Blood Thinner Instructions: Aspirin Instructions:  ERAS Protcol - PRE-SURGERY N/A   COVID vaccine -yes  Activity--Able to climb a flight of stairs without CP or SOB Work has at least 30 steps  Anesthesia review:   Patient denies shortness of breath, fever, cough and chest pain at PAT appointment   All instructions explained to the patient, with a verbal understanding of the material. Patient agrees to go over the instructions while at home for a better understanding. Patient also instructed to self quarantine after being tested for COVID-19. The opportunity to ask questions was provided.

## 2023-05-20 ENCOUNTER — Other Ambulatory Visit: Payer: Self-pay

## 2023-05-20 ENCOUNTER — Encounter (HOSPITAL_COMMUNITY)
Admission: RE | Admit: 2023-05-20 | Discharge: 2023-05-20 | Disposition: A | Discharge status: Home / Self Care | Source: Ambulatory Visit | Attending: Surgery | Admitting: Surgery

## 2023-05-20 ENCOUNTER — Encounter (HOSPITAL_COMMUNITY): Payer: Self-pay

## 2023-05-20 ENCOUNTER — Ambulatory Visit: Admit: 2023-05-20 | Admitting: Surgery

## 2023-05-20 VITALS — BP 143/86 | HR 94 | Ht 66.0 in | Wt 247.0 lb

## 2023-05-20 DIAGNOSIS — I10 Essential (primary) hypertension: Secondary | ICD-10-CM | POA: Diagnosis not present

## 2023-05-20 DIAGNOSIS — Z01812 Encounter for preprocedural laboratory examination: Secondary | ICD-10-CM | POA: Insufficient documentation

## 2023-05-20 DIAGNOSIS — Z01818 Encounter for other preprocedural examination: Secondary | ICD-10-CM

## 2023-05-20 HISTORY — DX: Headache, unspecified: R51.9

## 2023-05-20 HISTORY — DX: Unspecified osteoarthritis, unspecified site: M19.90

## 2023-05-20 LAB — CBC
HCT: 36.6 % (ref 36.0–46.0)
Hemoglobin: 11.1 g/dL — ABNORMAL LOW (ref 12.0–15.0)
MCH: 23.2 pg — ABNORMAL LOW (ref 26.0–34.0)
MCHC: 30.3 g/dL (ref 30.0–36.0)
MCV: 76.4 fL — ABNORMAL LOW (ref 80.0–100.0)
Platelets: 409 10*3/uL — ABNORMAL HIGH (ref 150–400)
RBC: 4.79 MIL/uL (ref 3.87–5.11)
RDW: 14.7 % (ref 11.5–15.5)
WBC: 6.1 10*3/uL (ref 4.0–10.5)
nRBC: 0 % (ref 0.0–0.2)

## 2023-05-20 LAB — BASIC METABOLIC PANEL
Anion gap: 8 (ref 5–15)
BUN: 13 mg/dL (ref 6–20)
CO2: 27 mmol/L (ref 22–32)
Calcium: 9.1 mg/dL (ref 8.9–10.3)
Chloride: 102 mmol/L (ref 98–111)
Creatinine, Ser: 0.71 mg/dL (ref 0.44–1.00)
GFR, Estimated: >60 mL/min (ref >60)
Glucose, Bld: 95 mg/dL (ref 70–99)
Potassium: 3.1 mmol/L — ABNORMAL LOW (ref 3.5–5.1)
Sodium: 137 mmol/L (ref 135–145)

## 2023-05-20 SURGERY — CHOLECYSTECTOMY, ROBOT-ASSISTED, LAPAROSCOPIC
Anesthesia: General

## 2023-05-24 ENCOUNTER — Other Ambulatory Visit: Payer: Self-pay

## 2023-05-24 ENCOUNTER — Encounter (HOSPITAL_COMMUNITY): Payer: Self-pay | Admitting: Surgery

## 2023-05-24 ENCOUNTER — Ambulatory Visit (HOSPITAL_COMMUNITY)
Admission: RE | Admit: 2023-05-24 | Discharge: 2023-05-24 | Disposition: A | Discharge status: Home / Self Care | Source: Ambulatory Visit | Attending: Surgery | Admitting: Surgery

## 2023-05-24 ENCOUNTER — Ambulatory Visit (HOSPITAL_COMMUNITY): Admitting: Anesthesiology

## 2023-05-24 ENCOUNTER — Ambulatory Visit (HOSPITAL_BASED_OUTPATIENT_CLINIC_OR_DEPARTMENT_OTHER): Admitting: Anesthesiology

## 2023-05-24 ENCOUNTER — Encounter (HOSPITAL_COMMUNITY)
Admission: RE | Disposition: A | Discharge status: Home / Self Care | Payer: Self-pay | Source: Ambulatory Visit | Attending: Surgery

## 2023-05-24 DIAGNOSIS — K8012 Calculus of gallbladder with acute and chronic cholecystitis without obstruction: Secondary | ICD-10-CM | POA: Diagnosis not present

## 2023-05-24 DIAGNOSIS — R21 Rash and other nonspecific skin eruption: Secondary | ICD-10-CM | POA: Insufficient documentation

## 2023-05-24 DIAGNOSIS — K8050 Calculus of bile duct without cholangitis or cholecystitis without obstruction: Secondary | ICD-10-CM | POA: Diagnosis present

## 2023-05-24 DIAGNOSIS — Z6839 Body mass index (BMI) 39.0-39.9, adult: Secondary | ICD-10-CM | POA: Insufficient documentation

## 2023-05-24 DIAGNOSIS — F419 Anxiety disorder, unspecified: Secondary | ICD-10-CM

## 2023-05-24 DIAGNOSIS — I10 Essential (primary) hypertension: Secondary | ICD-10-CM

## 2023-05-24 DIAGNOSIS — F17210 Nicotine dependence, cigarettes, uncomplicated: Secondary | ICD-10-CM

## 2023-05-24 DIAGNOSIS — K760 Fatty (change of) liver, not elsewhere classified: Secondary | ICD-10-CM | POA: Insufficient documentation

## 2023-05-24 DIAGNOSIS — K828 Other specified diseases of gallbladder: Secondary | ICD-10-CM | POA: Insufficient documentation

## 2023-05-24 DIAGNOSIS — K812 Acute cholecystitis with chronic cholecystitis: Secondary | ICD-10-CM

## 2023-05-24 DIAGNOSIS — E669 Obesity, unspecified: Secondary | ICD-10-CM | POA: Insufficient documentation

## 2023-05-24 DIAGNOSIS — Z79899 Other long term (current) drug therapy: Secondary | ICD-10-CM | POA: Diagnosis not present

## 2023-05-24 DIAGNOSIS — E66813 Obesity, class 3: Secondary | ICD-10-CM | POA: Insufficient documentation

## 2023-05-24 DIAGNOSIS — Z01818 Encounter for other preprocedural examination: Secondary | ICD-10-CM

## 2023-05-24 HISTORY — PX: CHOLECYSTECTOMY: SHX55

## 2023-05-24 LAB — POCT PREGNANCY, URINE: Preg Test, Ur: NEGATIVE

## 2023-05-24 SURGERY — LAPAROSCOPIC CHOLECYSTECTOMY
Anesthesia: General | Site: Abdomen

## 2023-05-24 MED ORDER — ONDANSETRON HCL 4 MG/2ML IJ SOLN
INTRAMUSCULAR | Status: DC | PRN
  Administered 2023-05-24: 4 mg via INTRAVENOUS

## 2023-05-24 MED ORDER — MIDAZOLAM HCL 2 MG/2ML IJ SOLN
INTRAMUSCULAR | Status: AC
  Filled 2023-05-24: qty 2

## 2023-05-24 MED ORDER — ACETAMINOPHEN 325 MG PO TABS: 650.0000 mg | ORAL_TABLET | ORAL | Status: DC | PRN

## 2023-05-24 MED ORDER — INDOCYANINE GREEN 25 MG IV SOLR
1.2500 mg | Freq: Once | INTRAVENOUS | Status: AC
  Administered 2023-05-24: 1.25 mg via INTRAVENOUS
  Filled 2023-05-24: qty 10

## 2023-05-24 MED ORDER — CHLORHEXIDINE GLUCONATE 4 % EX SOLN: 60.0000 mL | Freq: Once | CUTANEOUS | Status: DC

## 2023-05-24 MED ORDER — DEXAMETHASONE SODIUM PHOSPHATE 10 MG/ML IJ SOLN
INTRAMUSCULAR | Status: AC
  Filled 2023-05-24: qty 1

## 2023-05-24 MED ORDER — BUPIVACAINE-EPINEPHRINE (PF) 0.25% -1:200000 IJ SOLN
INTRAMUSCULAR | Status: AC
Start: 2023-05-24 — End: ?
  Filled 2023-05-24: qty 30

## 2023-05-24 MED ORDER — AMISULPRIDE (ANTIEMETIC) 5 MG/2ML IV SOLN
INTRAVENOUS | Status: AC
  Filled 2023-05-24: qty 4

## 2023-05-24 MED ORDER — ACETAMINOPHEN 650 MG RE SUPP: 650.0000 mg | RECTAL | Status: DC | PRN

## 2023-05-24 MED ORDER — FENTANYL CITRATE PF 50 MCG/ML IJ SOSY
25.0000 ug | PREFILLED_SYRINGE | INTRAMUSCULAR | Status: DC | PRN
  Administered 2023-05-24 (×2): 50 ug via INTRAVENOUS

## 2023-05-24 MED ORDER — TRAMADOL HCL 50 MG PO TABS
50.0000 mg | ORAL_TABLET | Freq: Four times a day (QID) | ORAL | 0 refills | Status: AC | PRN
Start: 2023-05-24 — End: 2023-05-29

## 2023-05-24 MED ORDER — DEXAMETHASONE SODIUM PHOSPHATE 10 MG/ML IJ SOLN
INTRAMUSCULAR | Status: DC | PRN
  Administered 2023-05-24: 8 mg via INTRAVENOUS

## 2023-05-24 MED ORDER — BUPIVACAINE LIPOSOME 1.3 % IJ SUSP: 20.0000 mL | Freq: Once | INTRAMUSCULAR | Status: DC

## 2023-05-24 MED ORDER — FENTANYL CITRATE (PF) 100 MCG/2ML IJ SOLN
INTRAMUSCULAR | Status: DC | PRN
  Administered 2023-05-24: 25 ug via INTRAVENOUS
  Administered 2023-05-24 (×2): 50 ug via INTRAVENOUS

## 2023-05-24 MED ORDER — BUPIVACAINE LIPOSOME 1.3 % IJ SUSP
INTRAMUSCULAR | Status: DC | PRN
  Administered 2023-05-24: 20 mL

## 2023-05-24 MED ORDER — LIDOCAINE HCL (PF) 2 % IJ SOLN
INTRAMUSCULAR | Status: AC
  Filled 2023-05-24: qty 5

## 2023-05-24 MED ORDER — OXYCODONE HCL 5 MG PO TABS: 5.0000 mg | ORAL_TABLET | Freq: Once | ORAL | Status: DC | PRN

## 2023-05-24 MED ORDER — BUPIVACAINE LIPOSOME 1.3 % IJ SUSP
INTRAMUSCULAR | Status: AC
  Filled 2023-05-24: qty 20

## 2023-05-24 MED ORDER — SUGAMMADEX SODIUM 200 MG/2ML IV SOLN
INTRAVENOUS | Status: DC | PRN
  Administered 2023-05-24: 200 mg via INTRAVENOUS

## 2023-05-24 MED ORDER — LACTATED RINGERS IR SOLN
Status: DC | PRN
  Administered 2023-05-24: 1000 mL

## 2023-05-24 MED ORDER — CHLORHEXIDINE GLUCONATE 0.12 % MT SOLN
15.0000 mL | Freq: Once | OROMUCOSAL | Status: AC
  Administered 2023-05-24: 15 mL via OROMUCOSAL

## 2023-05-24 MED ORDER — SUGAMMADEX SODIUM 200 MG/2ML IV SOLN
INTRAVENOUS | Status: AC
  Filled 2023-05-24: qty 2

## 2023-05-24 MED ORDER — PHENYLEPHRINE 80 MCG/ML (10ML) SYRINGE FOR IV PUSH (FOR BLOOD PRESSURE SUPPORT)
PREFILLED_SYRINGE | INTRAVENOUS | Status: AC
  Filled 2023-05-24: qty 10

## 2023-05-24 MED ORDER — PROPOFOL 10 MG/ML IV BOLUS
INTRAVENOUS | Status: AC
  Filled 2023-05-24: qty 20

## 2023-05-24 MED ORDER — BUPIVACAINE-EPINEPHRINE 0.25% -1:200000 IJ SOLN
INTRAMUSCULAR | Status: DC | PRN
  Administered 2023-05-24: 30 mL

## 2023-05-24 MED ORDER — CEFAZOLIN SODIUM-DEXTROSE 2-4 GM/100ML-% IV SOLN
2.0000 g | INTRAVENOUS | Status: AC
  Administered 2023-05-24: 2 g via INTRAVENOUS
  Filled 2023-05-24: qty 100

## 2023-05-24 MED ORDER — SUCCINYLCHOLINE CHLORIDE 200 MG/10ML IV SOSY
PREFILLED_SYRINGE | INTRAVENOUS | Status: AC
Start: 2023-05-24 — End: ?
  Filled 2023-05-24: qty 10

## 2023-05-24 MED ORDER — 0.9 % SODIUM CHLORIDE (POUR BTL) OPTIME
TOPICAL | Status: DC | PRN
  Administered 2023-05-24: 1000 mL

## 2023-05-24 MED ORDER — GABAPENTIN 300 MG PO CAPS
300.0000 mg | ORAL_CAPSULE | ORAL | Status: AC
  Administered 2023-05-24: 300 mg via ORAL
  Filled 2023-05-24: qty 1

## 2023-05-24 MED ORDER — LIDOCAINE HCL (CARDIAC) PF 100 MG/5ML IV SOSY
PREFILLED_SYRINGE | INTRAVENOUS | Status: DC | PRN
  Administered 2023-05-24: 60 mg via INTRAVENOUS

## 2023-05-24 MED ORDER — PHENYLEPHRINE HCL (PRESSORS) 10 MG/ML IV SOLN
INTRAVENOUS | Status: DC | PRN
  Administered 2023-05-24 (×3): 80 ug via INTRAVENOUS
  Administered 2023-05-24 (×2): 100 ug via INTRAVENOUS
  Administered 2023-05-24: 80 ug via INTRAVENOUS

## 2023-05-24 MED ORDER — ACETAMINOPHEN 500 MG PO TABS
1000.0000 mg | ORAL_TABLET | ORAL | Status: AC
  Administered 2023-05-24: 1000 mg via ORAL
  Filled 2023-05-24: qty 2

## 2023-05-24 MED ORDER — ROCURONIUM BROMIDE 100 MG/10ML IV SOLN
INTRAVENOUS | Status: DC | PRN
  Administered 2023-05-24: 30 mg via INTRAVENOUS
  Administered 2023-05-24 (×2): 5 mg via INTRAVENOUS
  Administered 2023-05-24: 10 mg via INTRAVENOUS

## 2023-05-24 MED ORDER — AMISULPRIDE (ANTIEMETIC) 5 MG/2ML IV SOLN
10.0000 mg | Freq: Once | INTRAVENOUS | Status: AC | PRN
  Administered 2023-05-24: 10 mg via INTRAVENOUS

## 2023-05-24 MED ORDER — SUCCINYLCHOLINE CHLORIDE 200 MG/10ML IV SOSY
PREFILLED_SYRINGE | INTRAVENOUS | Status: DC | PRN
  Administered 2023-05-24: 120 mg via INTRAVENOUS

## 2023-05-24 MED ORDER — ROCURONIUM BROMIDE 10 MG/ML (PF) SYRINGE
PREFILLED_SYRINGE | INTRAVENOUS | Status: AC
  Filled 2023-05-24: qty 10

## 2023-05-24 MED ORDER — DOCUSATE SODIUM 100 MG PO CAPS: 100.0000 mg | ORAL_CAPSULE | Freq: Two times a day (BID) | ORAL | 0 refills | Status: AC

## 2023-05-24 MED ORDER — ORAL CARE MOUTH RINSE: 15.0000 mL | Freq: Once | OROMUCOSAL | Status: AC

## 2023-05-24 MED ORDER — FENTANYL CITRATE (PF) 250 MCG/5ML IJ SOLN
INTRAMUSCULAR | Status: AC
  Filled 2023-05-24: qty 5

## 2023-05-24 MED ORDER — FENTANYL CITRATE PF 50 MCG/ML IJ SOSY
PREFILLED_SYRINGE | INTRAMUSCULAR | Status: AC
  Filled 2023-05-24: qty 2

## 2023-05-24 MED ORDER — ONDANSETRON HCL 4 MG/2ML IJ SOLN
INTRAMUSCULAR | Status: AC
  Filled 2023-05-24: qty 2

## 2023-05-24 MED ORDER — OXYCODONE HCL 5 MG PO TABS: 5.0000 mg | ORAL_TABLET | ORAL | Status: DC | PRN

## 2023-05-24 MED ORDER — OXYCODONE HCL 5 MG/5ML PO SOLN: 5.0000 mg | Freq: Once | ORAL | Status: DC | PRN

## 2023-05-24 MED ORDER — PROPOFOL 10 MG/ML IV BOLUS
INTRAVENOUS | Status: DC | PRN
  Administered 2023-05-24: 200 mg via INTRAVENOUS

## 2023-05-24 MED ORDER — LACTATED RINGERS IV SOLN: INTRAVENOUS | Status: DC | PRN

## 2023-05-24 MED ORDER — FENTANYL CITRATE PF 50 MCG/ML IJ SOSY: 25.0000 ug | PREFILLED_SYRINGE | INTRAMUSCULAR | Status: DC | PRN

## 2023-05-24 MED ORDER — MIDAZOLAM HCL 5 MG/5ML IJ SOLN
INTRAMUSCULAR | Status: DC | PRN
  Administered 2023-05-24 (×2): 1 mg via INTRAVENOUS

## 2023-05-24 SURGICAL SUPPLY — 40 items
APPLIER CLIP ROT 10 11.4 M/L (STAPLE) ×1 IMPLANT
BAG COUNTER SPONGE SURGICOUNT (BAG) ×1 IMPLANT
BENZOIN TINCTURE PRP APPL 2/3 (GAUZE/BANDAGES/DRESSINGS) IMPLANT
BNDG ADH 1X3 SHEER STRL LF (GAUZE/BANDAGES/DRESSINGS) IMPLANT
CABLE HIGH FREQUENCY MONO STRZ (ELECTRODE) ×1 IMPLANT
CHLORAPREP W/TINT 26 (MISCELLANEOUS) ×1 IMPLANT
CLIP APPLIE ROT 10 11.4 M/L (STAPLE) ×1 IMPLANT
COVER MAYO STAND XLG (MISCELLANEOUS) IMPLANT
COVER SURGICAL LIGHT HANDLE (MISCELLANEOUS) ×1 IMPLANT
DERMABOND ADVANCED .7 DNX12 (GAUZE/BANDAGES/DRESSINGS) IMPLANT
DRAPE C-ARM 42X120 X-RAY (DRAPES) IMPLANT
ELECT REM PT RETURN 15FT ADLT (MISCELLANEOUS) ×1 IMPLANT
ENDOLOOP SUT PDS II 0 18 (SUTURE) IMPLANT
GLOVE BIO SURGEON STRL SZ 6 (GLOVE) ×1 IMPLANT
GLOVE INDICATOR 6.5 STRL GRN (GLOVE) ×1 IMPLANT
GOWN STRL REUS W/ TWL LRG LVL3 (GOWN DISPOSABLE) ×1 IMPLANT
GRASPER SUT TROCAR 14GX15 (MISCELLANEOUS) ×1 IMPLANT
HEMOSTAT SNOW SURGICEL 2X4 (HEMOSTASIS) IMPLANT
IRRIG SUCT STRYKERFLOW 2 WTIP (MISCELLANEOUS) ×1 IMPLANT
IRRIGATION SUCT STRKRFLW 2 WTP (MISCELLANEOUS) ×1 IMPLANT
KIT BASIN OR (CUSTOM PROCEDURE TRAY) ×1 IMPLANT
KIT TURNOVER KIT A (KITS) IMPLANT
NDL INSUFFLATION 14GA 120MM (NEEDLE) ×1 IMPLANT
NEEDLE INSUFFLATION 14GA 120MM (NEEDLE) ×1 IMPLANT
POUCH RETRIEVAL ECOSAC 10 (ENDOMECHANICALS) IMPLANT
SCISSORS LAP 5X35 DISP (ENDOMECHANICALS) ×1 IMPLANT
SET CHOLANGIOGRAPH MIX (MISCELLANEOUS) IMPLANT
SET TUBE SMOKE EVAC HIGH FLOW (TUBING) ×1 IMPLANT
SLEEVE Z-THREAD 5X100MM (TROCAR) ×1 IMPLANT
SPIKE FLUID TRANSFER (MISCELLANEOUS) ×1 IMPLANT
STRIP CLOSURE SKIN 1/2X4 (GAUZE/BANDAGES/DRESSINGS) IMPLANT
SUT MNCRL AB 4-0 PS2 18 (SUTURE) ×1 IMPLANT
SUT VICRYL 0 UR6 27IN ABS (SUTURE) IMPLANT
SYS BAG RETRIEVAL 10MM (BASKET) ×1 IMPLANT
SYSTEM BAG RETRIEVAL 10MM (BASKET) IMPLANT
TOWEL OR 17X26 10 PK STRL BLUE (TOWEL DISPOSABLE) ×1 IMPLANT
TRAY LAPAROSCOPIC (CUSTOM PROCEDURE TRAY) ×1 IMPLANT
TROCAR ADV FIXATION 12X100MM (TROCAR) ×1 IMPLANT
TROCAR XCEL NON-BLD 5MMX100MML (ENDOMECHANICALS) IMPLANT
TROCAR Z-THREAD OPTICAL 5X100M (TROCAR) ×1 IMPLANT

## 2023-05-24 NOTE — Discharge Instructions (Signed)
 LAPAROSCOPIC SURGERY: POST OP INSTRUCTIONS   EAT Gradually transition to a high fiber diet with a fiber supplement over the next few weeks after discharge.  Start with a pureed / full liquid diet (see below)  WALK Walk an hour a day (cumulative- not all at once).  Control your pain to do that.    CONTROL PAIN Control pain so that you can walk, sleep, tolerate sneezing/coughing, go up/down stairs.  HAVE A BOWEL MOVEMENT DAILY Keep your bowels regular to avoid problems.  OK to try a laxative to override constipation.  OK to use an antidiarrheal to slow down diarrhea.  Call if not better after 2 tries  CALL IF YOU HAVE PROBLEMS/CONCERNS Call if you are still struggling despite following these instructions. Call if you have concerns not answered by these instructions    DIET: Follow a light bland diet & liquids the first 24 hours after arrival home, such as soup, liquids, starches, etc.  Be sure to drink plenty of fluids.  Quickly advance to a usual solid diet within a few days.  Avoid fast food or heavy meals initially as you are more likely to get nauseated or have irregular bowels.  A low-sugar, high-fiber diet for the rest of your life is ideal.  Take your usually prescribed home medications unless otherwise directed.  PAIN CONTROL: Pain is best controlled by a usual combination of three different methods TOGETHER: Ice/Heat Over the counter pain medication Prescription pain medication Most patients will experience some swelling and bruising around the incisions.  Ice packs or heating pads (30-60 minutes up to 6 times a day) will help. Use ice for the first few days to help decrease swelling and bruising, then switch to heat to help relax tight/sore spots and speed recovery.  Some people prefer to use ice alone, heat alone, alternating between ice & heat.  Experiment to what works for you.  Swelling and bruising can take several weeks to resolve.   It is helpful to take an  over-the-counter pain medication regularly for the first few days: Naproxen (Aleve, etc)  Two 220mg  tabs twice a day OR Ibuprofen (Advil, etc) Three 200mg  tabs four times a day (every meal & bedtime) AND Acetaminophen (Tylenol, etc) 500-650mg  four times a day (every meal & bedtime) A  prescription for pain medication (such as oxycodone, hydrocodone, tramadol, gabapentin, methocarbamol, etc) should be given to you upon discharge.  Take your pain medication as prescribed, IF NEEDED.  If you are having problems/concerns with the prescription medicine (does not control pain, nausea, vomiting, rash, itching, etc), please call us [phone] to see if we need to switch you to a different pain medicine that will work better for you and/or control your side effect better. If you need a refill on your pain medication, please give [place] 48 hour notice.  contact your pharmacy.  They will contact our office to request authorization. Prescriptions will not be filled after 5 pm or on week-ends  Avoid getting constipated.   Between the surgery and the pain medications, it is common to experience some constipation.   Increasing fluid intake and taking a fiber supplement (such as Metamucil, Citrucel, FiberCon, MiraLax, etc) 1-2 times a day regularly will usually help prevent this problem from occurring.   A mild laxative (prune juice, Milk of Magnesia, MiraLax, etc) should be taken according to package directions if there are no bowel movements after 48 hours.   Watch out for diarrhea.   If you have many  loose bowel movements, simplify your diet to bland foods & liquids for a few days.   Stop any stool softeners and decrease your fiber supplement.   Switching to mild anti-diarrheal medications (Kayopectate, Pepto Bismol) can help.   If this worsens or does not improve, please call us.  Wash / shower every day.  You may shower over the skin glue which is waterproof.  Do not soak or submerge incisions.  No rubbing,  scrubbing, lotions or ointments to incisions.  Glue will flake off after about 2 weeks.  You may leave the incision open to air.  You may replace a dressing/Band-Aid to cover the incision for comfort if you wish.   ACTIVITIES as tolerated:   You may resume regular (light) daily activities beginning the next day--such as daily self-care, walking, climbing stairs--gradually increasing activities as tolerated.  If you can walk 30 minutes without difficulty, it is safe to try more intense activity such as jogging, treadmill, bicycling, low-impact aerobics, swimming, etc. Save the most intensive and strenuous activity for last such as sit-ups, heavy lifting, contact sports, etc  Refrain from any heavy lifting or straining until you are off narcotics for pain control.   DO NOT PUSH THROUGH PAIN.  Let pain be your guide: If it hurts to do something, don't do it.  Pain is your body warning you to avoid that activity for another week until the pain goes down. You may drive when you are no longer taking prescription pain medication, you can comfortably wear a seatbelt, and you can safely maneuver your car and apply brakes. You may have sexual intercourse when it is comfortable.  FOLLOW UP in our office Please call [facility] at [phone] to set up an appointment to see your surgeon in the office for a follow-up appointment approximately 2-3 weeks after your surgery. Make sure that you call for this appointment the day you arrive home to insure a convenient appointment time.  10. IF YOU HAVE DISABILITY OR FAMILY LEAVE FORMS, BRING THEM TO THE OFFICE FOR PROCESSING.  DO NOT GIVE THEM TO YOUR DOCTOR.   WHEN TO CALL us [phone]: Poor pain control Reactions / problems with new medications (rash/itching, nausea, etc)  Fever over 101.5 F (38.5 C) Inability to urinate Nausea and/or vomiting Worsening swelling or bruising Continued bleeding from incision. Increased pain, redness, or drainage from the  incision   The clinic staff is available to answer your questions during regular business hours (8:30am-5pm).  Please don't hesitate to call and ask to speak to one of our nurses for clinical concerns.   If you have a medical emergency, go to the nearest emergency room or call 911.  A surgeon from [facility] is always on call at the [facility], [place] [address], [place], [place]  [address] ? MAIN: [phone] ? TOLL FREE: [phone] ?  FAX [phone] [email or web address]

## 2023-05-24 NOTE — Anesthesia Preprocedure Evaluation (Addendum)
 Anesthesia Evaluation  Patient identified by MRN, date of birth, ID band Patient awake    Reviewed: Allergy & Precautions, NPO status , Patient's Chart, lab work & pertinent test results  Airway Mallampati: II  TM Distance: >3 FB Neck ROM: Full    Dental  (+) Dental Advisory Given, Chipped,    Pulmonary Current Smoker and Patient abstained from smoking.   Pulmonary exam normal breath sounds clear to auscultation       Cardiovascular hypertension, Pt. on medications Normal cardiovascular exam Rhythm:Regular Rate:Normal     Neuro/Psych  Headaches PSYCHIATRIC DISORDERS Anxiety        GI/Hepatic negative GI ROS, Neg liver ROS,,,  Endo/Other    Class 3 obesity (BMI 40)  Renal/GU negative Renal ROS  negative genitourinary   Musculoskeletal  (+) Arthritis ,    Abdominal   Peds  Hematology negative hematology ROS (+)   Anesthesia Other Findings   Reproductive/Obstetrics                             Anesthesia Physical Anesthesia Plan  ASA: 3  Anesthesia Plan: General   Post-op Pain Management: Tylenol PO (pre-op)*   Induction: Intravenous  PONV Risk Score and Plan: 2 and Midazolam, Dexamethasone and Ondansetron  Airway Management Planned: Oral ETT  Additional Equipment:   Intra-op Plan:   Post-operative Plan: Extubation in OR  Informed Consent: I have reviewed the patients History and Physical, chart, labs and discussed the procedure including the risks, benefits and alternatives for the proposed anesthesia with the patient or authorized representative who has indicated his/her understanding and acceptance.     Dental advisory given  Plan Discussed with: CRNA  Anesthesia Plan Comments:        Anesthesia Quick Evaluation

## 2023-05-24 NOTE — Transfer of Care (Signed)
 Immediate Anesthesia Transfer of Care Note  Patient: Victoria Rivers  Procedure(s) Performed: LAPAROSCOPIC CHOLECYSTECTOMY (Abdomen) INDOCYANINE GREEN FLUORESCENCE IMAGING (ICG) (Abdomen)  Patient Location: PACU  Anesthesia Type:General  Level of Consciousness: awake, alert , oriented, and patient cooperative  Airway & Oxygen Therapy: Patient Spontanous Breathing and Patient connected to face mask oxygen  Post-op Assessment: Report given to RN and Post -op Vital signs reviewed and stable  Post vital signs: Reviewed and stable  Last Vitals:  Vitals Value Taken Time  BP 118/94 05/24/23 1141  Temp    Pulse 80 05/24/23 1144  Resp 24 05/24/23 1144  SpO2 100 % 05/24/23 1144  Vitals shown include unfiled device data.  Last Pain:  Vitals:   05/24/23 0706  TempSrc: Oral         Complications: No notable events documented.

## 2023-05-24 NOTE — Op Note (Signed)
 Operative Note  Victoria Rivers 48 y.o. female [id number]  05/24/2023  Surgeon: Berna Bue MD FACS  Procedure performed: Laparoscopic Cholecystectomy with near infrared fluorescent cholangiography  Preop diagnosis: Cholecystitis Post-op diagnosis/intraop findings: Severe acute on chronic cholecystitis with purulent bile in the gallbladder  Specimens: gallbladder  Retained items: none  EBL: minimal  Complications: none  Description of procedure: After obtaining informed consent the patient was brought to the operating room. Antibiotics were administered. SCD's were applied. General endotracheal anesthesia was initiated and a formal time-out was performed. The abdomen was prepped and draped in the usual sterile fashion and the abdomen was entered using  a left subcostal Veress needle  after instilling the site with local. Insufflation to was obtained, 5mm trocar and camera inserted using optical entry in the right upper quadrant, and gross inspection revealed no evidence of injury from our entry or other intraabdominal abnormalities. Two 5mm trocars were introduced in the supraumbilical and right anterior axillary lines under direct visualization and following infiltration with local. A 12mm trocar was placed in the epigastrium.  The gallbladder was contracted around numerous large stones, thickened and pale consistent with severe chronic cholecystitis.  The fundus of the gallbladder was retracted cephalad and the infundibulum was retracted laterally after lysing adhesions with a combination of blunt and cautery dissection, carefully protecting the adjacent duodenum.  Retracting the gallbladder was exceptionally difficult due to significant contraction around multiple stones and fibrotic changes along the gallbladder.. A combination of hook electrocautery and blunt dissection was utilized to clear the peritoneum from the neck and cystic duct, circumferentially isolating the cystic artery  and cystic duct and lifting the gallbladder from the cystic plate. The critical view of safety was achieved with the cystic artery, cystic duct, and liver bed visualized between them with no other structures.  Near infrared fluorescent cholangiography was activated and illuminated the gallbladder and liver, but no other structures were visible.  The cystic artery was clipped with a single clip proximally and distally and divided.  The cystic duct was ligated with 3 clips proximally and 1 distally and then divided sharply. The gallbladder was dissected from the liver plate using electrocautery. Once freed the gallbladder was placed in an endocatch bag and removed through the epigastric trocar site. A small amount of bleeding on the liver bed was controlled with cautery. Some purulent bile had been spilled from the gallbladder during its dissection from the liver bed. This was aspirated and the right upper quadrant was irrigated with warm sterile saline; the effluent was clear. Hemostasis was once again confirmed, and reinspection of the abdomen revealed no injuries. The clips were well apposed without any bile leak from the ligated remnant cystic duct or the liver bed either on direct view nor on near infrared fluorescent cholangiography.  The 12mm trocar site in the epigastrium was closed with two interrupted 0 vicryls in the fascia under direct visualization using a PMI device. The abdomen was desufflated and all trocars removed. The skin incisions were closed with running subcuticular 4-0 monocryl and Dermabond. The patient was awakened, extubated and transported to the recovery room in stable condition.    All counts were correct at the completion of the case.

## 2023-05-24 NOTE — Interval H&P Note (Signed)
 History and Physical Interval Note:  05/24/2023 7:35 AM  Victoria Rivers  has presented today for surgery, with the diagnosis of BILIARY COLIC.  The various methods of treatment have been discussed with the patient and family. After consideration of risks, benefits and other options for treatment, the patient has consented to  Procedure(s): LAPAROSCOPIC CHOLECYSTECTOMY (N/A) INDOCYANINE GREEN FLUORESCENCE IMAGING (ICG) (N/A) as a surgical intervention.  The patient's history has been reviewed, patient examined, no change in status, stable for surgery.  I have reviewed the patient's chart and labs.  Questions were answered to the patient's satisfaction.     Kutter Schnepf Lollie Sails

## 2023-05-24 NOTE — Anesthesia Procedure Notes (Signed)
 Procedure Name: Intubation Date/Time: 05/24/2023 9:42 AM  Performed by: Garth Bigness, CRNAPre-anesthesia Checklist: Patient identified, Emergency Drugs available, Suction available and Patient being monitored Patient Re-evaluated:Patient Re-evaluated prior to induction Oxygen Delivery Method: Circle system utilized Preoxygenation: Pre-oxygenation with 100% oxygen Induction Type: IV induction, Cricoid Pressure applied and Rapid sequence Ventilation: Mask ventilation without difficulty Laryngoscope Size: Mac and 3 Grade View: Grade III Tube type: Oral Tube size: 7.0 mm Number of attempts: 1 Airway Equipment and Method: Stylet Placement Confirmation: ETT inserted through vocal cords under direct vision, positive ETCO2 and breath sounds checked- equal and bilateral Secured at: 23 cm Tube secured with: Tape Dental Injury: Teeth and Oropharynx as per pre-operative assessment

## 2023-05-24 NOTE — Anesthesia Postprocedure Evaluation (Signed)
 Anesthesia Post Note  Patient: [name]  Procedure(s) Performed: LAPAROSCOPIC CHOLECYSTECTOMY (Abdomen) INDOCYANINE GREEN FLUORESCENCE IMAGING (ICG) (Abdomen)     Patient location during evaluation: PACU Anesthesia Type: General Level of consciousness: awake and alert Pain management: pain level controlled Vital Signs Assessment: post-procedure vital signs reviewed and stable Respiratory status: spontaneous breathing, nonlabored ventilation, respiratory function stable and patient connected to nasal cannula oxygen Cardiovascular status: blood pressure returned to baseline and stable Postop Assessment: no apparent nausea or vomiting Anesthetic complications: no  No notable events documented.  Last Vitals:  Vitals:   05/24/23 1215 05/24/23 1218  BP: 134/85   Pulse: 92 87  Resp: 18 16  Temp:    SpO2: 96% 95%    Last Pain:  Vitals:   05/24/23 1215  TempSrc:   PainSc: 3                  Jens Siems L Marissia Blackham

## 2023-05-25 ENCOUNTER — Encounter (HOSPITAL_COMMUNITY): Payer: Self-pay | Admitting: Surgery

## 2023-05-25 LAB — SURGICAL PATHOLOGY

## 2023-08-05 ENCOUNTER — Ambulatory Visit
Admission: EM | Admit: 2023-08-05 | Discharge: 2023-08-05 | Disposition: A | Discharge status: Home / Self Care | Attending: Emergency Medicine | Admitting: Emergency Medicine

## 2023-08-05 DIAGNOSIS — M67431 Ganglion, right wrist: Secondary | ICD-10-CM | POA: Diagnosis not present

## 2023-08-05 MED ORDER — PREDNISONE 20 MG PO TABS: 40.0000 mg | ORAL_TABLET | Freq: Every day | ORAL | 0 refills | Status: AC

## 2023-08-05 NOTE — ED Triage Notes (Signed)
 Triaged by provider

## 2023-08-05 NOTE — Discharge Instructions (Signed)
 Your evaluated for your discomfort to your wrist, on exam there is a ganglion cyst within the joint space which is most likely the cause of symptoms as there was no injury, these are typically harmless and can resolve on their own with time however they also can elicit pain as they push on the area surrounding it  You have been given a wrist brace for stability and support May use as needed when completing activity  Begin prednisone  every morning with food for 5 days to reduce internal inflammation and irritation to the wrist, avoid use of ibuprofen  during treatment, if you do not like the way the medicine feels you may stop use and begin ibuprofen  taking 600 to 800 mg as needed  You may apply heat over the affected area 10 to 15-minute intervals to provide comfort  If discomfort becomes more prominent or cyst becomes larger you may follow-up with orthopedics whose information is listed on front page for further management

## 2023-08-05 NOTE — ED Provider Notes (Signed)
 Victoria Rivers    CSN: [id number] Arrival date & time: 08/05/23  1243      History   Chief Complaint No chief complaint on file.   HPI Victoria Rivers is a 48 y.o. female.   Patient presents for evaluation of intermittent right wrist pain beginning 2 days ago.  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  Pain is elicited with movement, exacerbated by supination.  Denies numbness or tingling.  Able to feel a bump at the cyst.  Past Medical History:  Diagnosis Date   Anxiety    no meds   Arthritis    BV (bacterial vaginosis)    Chlamydia    Gonorrhea    Headache    1 time a year migraine   Hypertension    Pregnancy, ectopic     Patient Active Problem List   Diagnosis Date Noted   Vaginal bleeding 05/19/2022   Acute viral bronchitis 05/05/2014   Sinus tachycardia 05/05/2014   Lactic acidosis 05/05/2014   Nausea and vomiting 05/05/2014   Hypokalemia 05/05/2014   Influenza with respiratory manifestation     Past Surgical History:  Procedure Laterality Date   CHOLECYSTECTOMY N/A 05/24/2023   Procedure: LAPAROSCOPIC CHOLECYSTECTOMY;  Surgeon: Victoria Acton, MD;  Location: [facility];  Service: General;  Laterality: N/A;   DILATION AND CURETTAGE OF UTERUS     ECTOPIC PREGNANCY SURGERY     pt denies, states only received MTX   THERAPEUTIC ABORTION      OB History     Gravida  8   Para  4   Term  4   Preterm      AB  3   Living  4      SAB      IAB  2   Ectopic  1   Multiple      Live Births  4        Obstetric Comments  MTX for ectopic          Home Medications    Prior to Admission medications   Medication Sig Start Date End Date Taking? Authorizing Provider  predniSONE  (DELTASONE ) 20 MG tablet Take 2 tablets (40 mg total) by mouth daily. 08/05/23  Yes Kirti Carl, Maybelle Spatz, NP  Aspirin-Acetaminophen -Caffeine (BC  FAST PAIN RELIEF MAX STR PO) Take 1 packet by mouth 2 (two) times daily as needed (pain.).    [provider]  hydrochlorothiazide (HYDRODIURIL) 25 MG tablet Take 25 mg by mouth in the morning.    [provider]  Hydrocortisone (CORTIZONE-10 EX) Apply 1 Application topically 3 (three) times daily as needed (skin irritation/itching/eczema).    [provider]  ibuprofen  (ADVIL ) 200 MG tablet Take 600 mg by mouth every 8 (eight) hours as needed (pain.).    [provider]    Family History Family History  Problem Relation Age of Onset   Hypertension Mother    Sleep apnea Father    Diabetes Maternal Grandmother    Heart disease Maternal Grandmother    Hypertension Maternal Grandmother    Gout Maternal Grandmother    Stroke Paternal Grandmother     Social History Social History   Tobacco Use   Smoking status: Some Days    Types: Cigarettes   Smokeless tobacco: Never   Tobacco comments:    4 a day  Vaping Use   Vaping status: Some  Days  Substance Use Topics   Alcohol use: Not Currently    Comment: occ   Drug use: No     Allergies   Morphine and codeine   Review of Systems Review of Systems   Physical Exam Triage Vital Signs ED Triage Vitals [08/05/23 1305]  Encounter Vitals Group     BP 114/69     Systolic BP Percentile      Diastolic BP Percentile      Pulse Rate (!) 103     Resp 18     Temp 99.3 F (37.4 C)     Temp Source Oral     SpO2 98 %     Weight      Height      Head Circumference      Peak Flow      Pain Score      Pain Loc      Pain Education      Exclude from Growth Chart    No data found.  Updated Vital Signs BP 114/69 (BP Location: Right Arm)   Pulse (!) 103   Temp 99.3 F (37.4 C) (Oral)   Resp 18   SpO2 98%   Visual Acuity Right Eye Distance:   Left Eye Distance:   Bilateral Distance:    Right Eye Near:   Left Eye Near:    Bilateral Near:     Physical Exam Constitutional:       Appearance: Normal appearance.  Eyes:     Extraocular Movements: Extraocular movements intact.  Pulmonary:     Effort: Pulmonary effort is normal.  Musculoskeletal:     Comments: Ganglionic cyst present along the volar aspect of the right wrist along the radius, no ecchymosis swelling or deformity, able to complete full range of motion, 2+ radial pulse  Neurological:     Mental Status: She is alert and oriented to person, place, and time. Mental status is at baseline.      UC Treatments / Results  Labs (all labs ordered are listed, but only abnormal results are displayed) Labs Reviewed - No data to display  EKG   Radiology No results found.  Procedures Procedures (including critical care time)  Medications Ordered in UC Medications - No data to display  Initial Impression / Assessment and Plan / UC Course  I have reviewed the triage vital signs and the nursing notes.  Pertinent labs & imaging results that were available during my care of the patient were reviewed by me and considered in my medical decision making (see chart for details).  Ganglion cyst of volar aspect of right wrist  Cyst present on exam, discussed findings with patient most likely etiology of pain, discussed, right wrist brace given, prescribed prednisone  and recommended supportive care through RICE heat massage stretching with activity as tolerated, walker referral given orthopedics worsening symptoms Final Clinical Impressions(s) / UC Diagnoses   Final diagnoses:  Ganglion cyst of volar aspect of right wrist   Discharge Instructions      Your evaluated for your discomfort to your wrist, on exam there is a ganglion cyst within the joint space which is most likely the cause of symptoms as there was no injury, these are typically harmless and can resolve on their own with time however they also can elicit pain as they push on the area surrounding it  You have been given a wrist brace for stability and  support May use as needed when completing activity  Begin prednisone   every morning with food for 5 days to reduce internal inflammation and irritation to the wrist, avoid use of ibuprofen  during treatment, if you do not like the way the medicine feels you may stop use and begin ibuprofen  taking 600 to 800 mg as needed  You may apply heat over the affected area 10 to 15-minute intervals to provide comfort  If discomfort becomes more prominent or cyst becomes larger you may follow-up with orthopedics whose information is listed on front page for further management  ED Prescriptions     Medication Sig Dispense Auth. Provider   predniSONE  (DELTASONE ) 20 MG tablet Take 2 tablets (40 mg total) by mouth daily. 10 tablet Mikki Ziff R, NP      PDMP not reviewed this encounter.   Reena Canning, NP 08/05/23 1432

## 2023-08-07 ENCOUNTER — Emergency Department (HOSPITAL_BASED_OUTPATIENT_CLINIC_OR_DEPARTMENT_OTHER)

## 2023-08-07 ENCOUNTER — Other Ambulatory Visit: Payer: Self-pay

## 2023-08-07 ENCOUNTER — Encounter (HOSPITAL_BASED_OUTPATIENT_CLINIC_OR_DEPARTMENT_OTHER): Payer: Self-pay | Admitting: Emergency Medicine

## 2023-08-07 ENCOUNTER — Emergency Department (HOSPITAL_BASED_OUTPATIENT_CLINIC_OR_DEPARTMENT_OTHER)
Admission: EM | Admit: 2023-08-07 | Discharge: 2023-08-08 | Disposition: A | Discharge status: Home / Self Care | Attending: Emergency Medicine | Admitting: Emergency Medicine

## 2023-08-07 DIAGNOSIS — Z7982 Long term (current) use of aspirin: Secondary | ICD-10-CM | POA: Insufficient documentation

## 2023-08-07 DIAGNOSIS — R0789 Other chest pain: Secondary | ICD-10-CM | POA: Insufficient documentation

## 2023-08-07 DIAGNOSIS — I10 Essential (primary) hypertension: Secondary | ICD-10-CM | POA: Insufficient documentation

## 2023-08-07 DIAGNOSIS — Z79899 Other long term (current) drug therapy: Secondary | ICD-10-CM | POA: Insufficient documentation

## 2023-08-07 DIAGNOSIS — R079 Chest pain, unspecified: Secondary | ICD-10-CM | POA: Diagnosis present

## 2023-08-07 LAB — CBC
HCT: 36.8 % (ref 36.0–46.0)
Hemoglobin: 11.3 g/dL — ABNORMAL LOW (ref 12.0–15.0)
MCH: 22.8 pg — ABNORMAL LOW (ref 26.0–34.0)
MCHC: 30.7 g/dL (ref 30.0–36.0)
MCV: 74.2 fL — ABNORMAL LOW (ref 80.0–100.0)
Platelets: 440 10*3/uL — ABNORMAL HIGH (ref 150–400)
RBC: 4.96 MIL/uL (ref 3.87–5.11)
RDW: 15.6 % — ABNORMAL HIGH (ref 11.5–15.5)
WBC: 9.6 10*3/uL (ref 4.0–10.5)
nRBC: 0 % (ref 0.0–0.2)

## 2023-08-07 LAB — BASIC METABOLIC PANEL WITH GFR
Anion gap: 15 (ref 5–15)
BUN: 12 mg/dL (ref 6–20)
CO2: 25 mmol/L (ref 22–32)
Calcium: 9.9 mg/dL (ref 8.9–10.3)
Chloride: 100 mmol/L (ref 98–111)
Creatinine, Ser: 0.8 mg/dL (ref 0.44–1.00)
GFR, Estimated: >60 mL/min (ref >60)
Glucose, Bld: 99 mg/dL (ref 70–99)
Potassium: 3.3 mmol/L — ABNORMAL LOW (ref 3.5–5.1)
Sodium: 140 mmol/L (ref 135–145)

## 2023-08-07 LAB — TROPONIN T, HIGH SENSITIVITY: Troponin T High Sensitivity: <15 ng/L (ref <19)

## 2023-08-07 NOTE — ED Triage Notes (Signed)
 Pt arrives w/ c/o chest pain. Reports it has been going on x 1 week and mainly on the left side. Denies n/v, dizziness.

## 2023-08-07 NOTE — ED Provider Notes (Signed)
 [place] EMERGENCY DEPARTMENT AT [facility] Provider Note   CSN: [id number] Arrival date & time: 08/07/23  1947     History  Chief Complaint  Patient presents with   Chest Pain    Victoria Rivers is a 48 y.o. female.  Patient is a 48 year old female with past medical history of hypertension.  Patient presenting today with complaints of left-sided chest pain.  This has been occurring intermittently throughout the week.  She describes a sharp pain behind her left breast that comes and goes, but not related to exertion.  She denies any shortness of breath, nausea, or diaphoresis.  There is no radiation into the arm or jaw.  She denies any recent exertional symptoms.  Patient has no prior cardiac history.         Home Medications Prior to Admission medications   Medication Sig Start Date End Date Taking? Authorizing Provider  Aspirin-Acetaminophen -Caffeine (BC FAST PAIN RELIEF MAX STR PO) Take 1 packet by mouth 2 (two) times daily as needed (pain.).    [provider]  hydrochlorothiazide (HYDRODIURIL) 25 MG tablet Take 25 mg by mouth in the morning.    [provider]  Hydrocortisone (CORTIZONE-10 EX) Apply 1 Application topically 3 (three) times daily as needed (skin irritation/itching/eczema).    [provider]  ibuprofen  (ADVIL ) 200 MG tablet Take 600 mg by mouth every 8 (eight) hours as needed (pain.).    [provider]  predniSONE  (DELTASONE ) 20 MG tablet Take 2 tablets (40 mg total) by mouth daily. 08/05/23   Reena Canning, NP      Allergies    Morphine and codeine    Review of Systems   Review of Systems  All other systems reviewed and are negative.   Physical Exam Updated Vital Signs BP 132/86   Pulse 92   Temp 98.1 F (36.7 C)   Resp 20   Ht 5\' 6"  (1.676 m)   Wt 114 kg   SpO2 100%   BMI 40.56 kg/m  Physical Exam Vitals and nursing note reviewed.  Constitutional:      General: She is not in acute  distress.    Appearance: She is well-developed. She is not diaphoretic.  HENT:     Head: Normocephalic and atraumatic.  Cardiovascular:     Rate and Rhythm: Normal rate and regular rhythm.     Heart sounds: No murmur heard.    No friction rub. No gallop.  Pulmonary:     Effort: Pulmonary effort is normal. No respiratory distress.     Breath sounds: Normal breath sounds. No wheezing.  Abdominal:     General: Bowel sounds are normal. There is no distension.     Palpations: Abdomen is soft.     Tenderness: There is no abdominal tenderness.  Musculoskeletal:        General: Normal range of motion.     Cervical back: Normal range of motion and neck supple.  Skin:    General: Skin is warm and dry.  Neurological:     General: No focal deficit present.     Mental Status: She is alert and oriented to person, place, and time.     ED Results / Procedures / Treatments   Labs (all labs ordered are listed, but only abnormal results are displayed) Labs Reviewed  BASIC METABOLIC PANEL WITH GFR - Abnormal; Notable for the following components:      Result Value   Potassium 3.3 (*)    All  other components within normal limits  CBC - Abnormal; Notable for the following components:   Hemoglobin 11.3 (*)    MCV 74.2 (*)    MCH 22.8 (*)    RDW 15.6 (*)    Platelets 440 (*)    All other components within normal limits  PREGNANCY, URINE  TROPONIN T, HIGH SENSITIVITY  TROPONIN T, HIGH SENSITIVITY    EKG EKG Interpretation Date/Time:  Saturday Aug 07 2023 19:59:16 EDT Ventricular Rate:  98 PR Interval:  147 QRS Duration:  94 QT Interval:  333 QTC Calculation: 426 R Axis:   9  Text Interpretation: Sinus rhythm Abnormal R-wave progression, early transition No significant change since prior 2/25 Confirmed by Racheal Buddle [phone]) on 08/07/2023 8:09:30 PM  Radiology DG Chest Port 1 View Result Date: 08/07/2023 CLINICAL DATA:  Chest pain for 1 week, mainly on the left side. EXAM:  PORTABLE CHEST 1 VIEW COMPARISON:  01/22/2019 FINDINGS: The heart size and mediastinal contours are within normal limits. Both lungs are clear. The visualized skeletal structures are unremarkable. IMPRESSION: No active disease. Electronically Signed   By: Boyce Byes M.D.   On: 08/07/2023 23:33    Procedures Procedures    Medications Ordered in ED Medications - No data to display  ED Course/ Medical Decision Making/ A&P  Patient presenting here with complaints of left-sided chest pain as described in the HPI.  The patient arrives here with stable vital signs and is afebrile.  Physical examination is unremarkable.  Laboratory studies obtained including CBC, metabolic panel, and troponin, all of which are normal.  Chest x-ray showing no acute process.  I doubt a cardiac etiology given the atypical nature of her discomfort and the duration of symptoms with normal workup.  I feel as though patient can safely be discharged.  She does report some excessive belching and believes this may be related.  I will have her try Prilosec for possible reflux as well as naproxen  as an anti-inflammatory.  Patient is to follow-up with primary doctor if not improving and return if she worsens.  Final Clinical Impression(s) / ED Diagnoses Final diagnoses:  None    Rx / DC Orders ED Discharge Orders     None         Orvilla Blander, MD 08/07/23 2359

## 2023-08-08 NOTE — Discharge Instructions (Signed)
 Begin taking omeprazole 20 mg twice daily for the next 2 weeks.  Begin taking ibuprofen  600 mg 3 times daily for the next several days.  Rest.  Return to the emergency department if you develop worsening pain, difficulty breathing, high fevers, or for other new and concerning symptoms.
